# Patient Record
Sex: Male | Born: 1950 | Race: Black or African American | Hispanic: No | Marital: Single | Smoking: Former smoker
Health system: Southern US, Community
[De-identification: ages and names within clinical notes are randomized; demographics above are authoritative.]

---

## 2019-07-26 ENCOUNTER — Observation Stay (HOSPITAL_COMMUNITY): Payer: Medicare Other

## 2019-07-26 ENCOUNTER — Emergency Department (HOSPITAL_COMMUNITY): Payer: Medicare Other

## 2019-07-26 ENCOUNTER — Ambulatory Visit (HOSPITAL_BASED_OUTPATIENT_CLINIC_OR_DEPARTMENT_OTHER): Payer: Medicare Other

## 2019-07-26 ENCOUNTER — Encounter (HOSPITAL_COMMUNITY): Payer: Self-pay | Admitting: Family Medicine

## 2019-07-26 ENCOUNTER — Observation Stay (HOSPITAL_COMMUNITY)
Admission: EM | Admit: 2019-07-26 | Discharge: 2019-07-27 | Disposition: A | Discharge status: Home / Self Care | Payer: Medicare Other | Attending: Internal Medicine | Admitting: Internal Medicine

## 2019-07-26 ENCOUNTER — Other Ambulatory Visit: Payer: Self-pay

## 2019-07-26 DIAGNOSIS — E119 Type 2 diabetes mellitus without complications: Secondary | ICD-10-CM | POA: Insufficient documentation

## 2019-07-26 DIAGNOSIS — Z79899 Other long term (current) drug therapy: Secondary | ICD-10-CM | POA: Diagnosis not present

## 2019-07-26 DIAGNOSIS — F1210 Cannabis abuse, uncomplicated: Secondary | ICD-10-CM | POA: Diagnosis not present

## 2019-07-26 DIAGNOSIS — E785 Hyperlipidemia, unspecified: Secondary | ICD-10-CM | POA: Diagnosis not present

## 2019-07-26 DIAGNOSIS — Z6841 Body Mass Index (BMI) 40.0 and over, adult: Secondary | ICD-10-CM | POA: Diagnosis not present

## 2019-07-26 DIAGNOSIS — I10 Essential (primary) hypertension: Secondary | ICD-10-CM | POA: Diagnosis not present

## 2019-07-26 DIAGNOSIS — I63511 Cerebral infarction due to unspecified occlusion or stenosis of right middle cerebral artery: Secondary | ICD-10-CM | POA: Diagnosis not present

## 2019-07-26 DIAGNOSIS — G8194 Hemiplegia, unspecified affecting left nondominant side: Secondary | ICD-10-CM | POA: Insufficient documentation

## 2019-07-26 DIAGNOSIS — Z20822 Contact with and (suspected) exposure to covid-19: Secondary | ICD-10-CM | POA: Diagnosis not present

## 2019-07-26 DIAGNOSIS — R2689 Other abnormalities of gait and mobility: Secondary | ICD-10-CM | POA: Diagnosis not present

## 2019-07-26 DIAGNOSIS — I2510 Atherosclerotic heart disease of native coronary artery without angina pectoris: Secondary | ICD-10-CM | POA: Diagnosis present

## 2019-07-26 DIAGNOSIS — I639 Cerebral infarction, unspecified: Secondary | ICD-10-CM | POA: Diagnosis present

## 2019-07-26 DIAGNOSIS — Z7982 Long term (current) use of aspirin: Secondary | ICD-10-CM | POA: Insufficient documentation

## 2019-07-26 DIAGNOSIS — Z87891 Personal history of nicotine dependence: Secondary | ICD-10-CM | POA: Insufficient documentation

## 2019-07-26 DIAGNOSIS — E1159 Type 2 diabetes mellitus with other circulatory complications: Secondary | ICD-10-CM

## 2019-07-26 DIAGNOSIS — I340 Nonrheumatic mitral (valve) insufficiency: Secondary | ICD-10-CM

## 2019-07-26 DIAGNOSIS — R531 Weakness: Secondary | ICD-10-CM | POA: Diagnosis present

## 2019-07-26 HISTORY — DX: Atherosclerotic heart disease of native coronary artery without angina pectoris: I25.10

## 2019-07-26 HISTORY — DX: Essential (primary) hypertension: I10

## 2019-07-26 HISTORY — DX: Hyperlipidemia, unspecified: E78.5

## 2019-07-26 LAB — RAPID URINE DRUG SCREEN, HOSP PERFORMED
Amphetamines: NOT DETECTED
Barbiturates: NOT DETECTED
Benzodiazepines: NOT DETECTED
Cocaine: NOT DETECTED
Opiates: NOT DETECTED
Tetrahydrocannabinol: POSITIVE — AB

## 2019-07-26 LAB — I-STAT CHEM 8, ED
BUN: 19 mg/dL (ref 8–23)
Calcium, Ion: 1.17 mmol/L (ref 1.15–1.40)
Chloride: 103 mmol/L (ref 98–111)
Creatinine, Ser: 1.2 mg/dL (ref 0.61–1.24)
Glucose, Bld: 92 mg/dL (ref 70–99)
HCT: 44 % (ref 39.0–52.0)
Hemoglobin: 15 g/dL (ref 13.0–17.0)
Potassium: 4 mmol/L (ref 3.5–5.1)
Sodium: 140 mmol/L (ref 135–145)
TCO2: 29 mmol/L (ref 22–32)

## 2019-07-26 LAB — LIPID PANEL
Cholesterol: 123 mg/dL (ref 0–200)
HDL: 45 mg/dL (ref >40)
LDL Cholesterol: 70 mg/dL (ref 0–99)
Total CHOL/HDL Ratio: 2.7 RATIO
Triglycerides: 39 mg/dL (ref <150)
VLDL: 8 mg/dL (ref 0–40)

## 2019-07-26 LAB — DIFFERENTIAL
Abs Immature Granulocytes: 0.02 10*3/uL (ref 0.00–0.07)
Basophils Absolute: 0.1 10*3/uL (ref 0.0–0.1)
Basophils Relative: 1 %
Eosinophils Absolute: 0.5 10*3/uL (ref 0.0–0.5)
Eosinophils Relative: 5 %
Immature Granulocytes: 0 %
Lymphocytes Relative: 19 %
Lymphs Abs: 1.9 10*3/uL (ref 0.7–4.0)
Monocytes Absolute: 0.6 10*3/uL (ref 0.1–1.0)
Monocytes Relative: 6 %
Neutro Abs: 7.2 10*3/uL (ref 1.7–7.7)
Neutrophils Relative %: 69 %

## 2019-07-26 LAB — CBC
HCT: 42.7 % (ref 39.0–52.0)
Hemoglobin: 13.7 g/dL (ref 13.0–17.0)
MCH: 28.9 pg (ref 26.0–34.0)
MCHC: 32.1 g/dL (ref 30.0–36.0)
MCV: 90.1 fL (ref 80.0–100.0)
Platelets: 288 10*3/uL (ref 150–400)
RBC: 4.74 MIL/uL (ref 4.22–5.81)
RDW: 13.5 % (ref 11.5–15.5)
WBC: 10.2 10*3/uL (ref 4.0–10.5)
nRBC: 0 % (ref 0.0–0.2)

## 2019-07-26 LAB — ECHOCARDIOGRAM COMPLETE
Height: 65 in
Weight: 4352 oz

## 2019-07-26 LAB — COMPREHENSIVE METABOLIC PANEL
ALT: 18 U/L (ref 0–44)
AST: 20 U/L (ref 15–41)
Albumin: 3.3 g/dL — ABNORMAL LOW (ref 3.5–5.0)
Alkaline Phosphatase: 98 U/L (ref 38–126)
Anion gap: 10 (ref 5–15)
BUN: 16 mg/dL (ref 8–23)
CO2: 24 mmol/L (ref 22–32)
Calcium: 9 mg/dL (ref 8.9–10.3)
Chloride: 105 mmol/L (ref 98–111)
Creatinine, Ser: 1.27 mg/dL — ABNORMAL HIGH (ref 0.61–1.24)
GFR calc Af Amer: >60 mL/min (ref >60)
GFR calc non Af Amer: 58 mL/min — ABNORMAL LOW (ref >60)
Glucose, Bld: 98 mg/dL (ref 70–99)
Potassium: 3.9 mmol/L (ref 3.5–5.1)
Sodium: 139 mmol/L (ref 135–145)
Total Bilirubin: 0.6 mg/dL (ref 0.3–1.2)
Total Protein: 6.8 g/dL (ref 6.5–8.1)

## 2019-07-26 LAB — CBG MONITORING, ED: Glucose-Capillary: 97 mg/dL (ref 70–99)

## 2019-07-26 LAB — PROTIME-INR
INR: 1.1 (ref 0.8–1.2)
Prothrombin Time: 14 seconds (ref 11.4–15.2)

## 2019-07-26 LAB — SARS CORONAVIRUS 2 (TAT 6-24 HRS): SARS Coronavirus 2: NEGATIVE

## 2019-07-26 LAB — HEMOGLOBIN A1C
Hgb A1c MFr Bld: 6 % — ABNORMAL HIGH (ref 4.8–5.6)
Mean Plasma Glucose: 125.5 mg/dL

## 2019-07-26 LAB — APTT: aPTT: 29 seconds (ref 24–36)

## 2019-07-26 MED ORDER — ACETAMINOPHEN 325 MG PO TABS: 650.0000 mg | ORAL_TABLET | ORAL | Status: DC | PRN

## 2019-07-26 MED ORDER — SODIUM CHLORIDE 0.9 % IV BOLUS
500.0000 mL | Freq: Once | INTRAVENOUS | Status: AC
  Administered 2019-07-26: 500 mL via INTRAVENOUS

## 2019-07-26 MED ORDER — ACETAMINOPHEN 160 MG/5ML PO SOLN
650.0000 mg | ORAL | Status: DC | PRN
  Administered 2019-07-26: 650 mg
  Filled 2019-07-26: qty 20.3

## 2019-07-26 MED ORDER — SODIUM CHLORIDE 0.9 % IV BOLUS
1000.0000 mL | Freq: Once | INTRAVENOUS | Status: AC
  Administered 2019-07-26: 1000 mL via INTRAVENOUS

## 2019-07-26 MED ORDER — IOHEXOL 350 MG/ML SOLN
50.0000 mL | Freq: Once | INTRAVENOUS | Status: AC | PRN
  Administered 2019-07-26: 50 mL via INTRAVENOUS

## 2019-07-26 MED ORDER — ASPIRIN 325 MG PO TABS
325.0000 mg | ORAL_TABLET | Freq: Every day | ORAL | Status: DC
  Administered 2019-07-26 – 2019-07-27 (×2): 325 mg via ORAL
  Filled 2019-07-26 (×2): qty 1

## 2019-07-26 MED ORDER — ACETAMINOPHEN 650 MG RE SUPP: 650.0000 mg | RECTAL | Status: DC | PRN

## 2019-07-26 MED ORDER — ATORVASTATIN CALCIUM 80 MG PO TABS
80.0000 mg | ORAL_TABLET | Freq: Every day | ORAL | Status: DC
  Administered 2019-07-26 – 2019-07-27 (×2): 80 mg via ORAL
  Filled 2019-07-26 (×2): qty 1

## 2019-07-26 MED ORDER — SODIUM CHLORIDE 0.9% FLUSH
3.0000 mL | Freq: Once | INTRAVENOUS | Status: DC
Start: 2019-07-26 — End: 2019-07-27

## 2019-07-26 MED ORDER — ENOXAPARIN SODIUM 40 MG/0.4ML ~~LOC~~ SOLN
40.0000 mg | Freq: Every day | SUBCUTANEOUS | Status: DC
  Administered 2019-07-26 – 2019-07-27 (×2): 40 mg via SUBCUTANEOUS
  Filled 2019-07-26 (×2): qty 0.4

## 2019-07-26 MED ORDER — ALBUMIN HUMAN 5 % IV SOLN
12.5000 g | Freq: Four times a day (QID) | INTRAVENOUS | Status: AC
  Administered 2019-07-26 – 2019-07-27 (×4): 12.5 g via INTRAVENOUS
  Filled 2019-07-26 (×4): qty 250

## 2019-07-26 MED ORDER — SODIUM CHLORIDE 0.9 % IV SOLN: INTRAVENOUS | Status: DC

## 2019-07-26 MED ORDER — CLOPIDOGREL BISULFATE 75 MG PO TABS
75.0000 mg | ORAL_TABLET | Freq: Every day | ORAL | Status: DC
  Administered 2019-07-26 – 2019-07-27 (×2): 75 mg via ORAL
  Filled 2019-07-26 (×2): qty 1

## 2019-07-26 MED ORDER — ASPIRIN 300 MG RE SUPP: 300.0000 mg | Freq: Every day | RECTAL | Status: DC

## 2019-07-26 MED ORDER — SODIUM CHLORIDE 0.9 % IV SOLN: INTRAVENOUS | Status: AC

## 2019-07-26 MED ORDER — SENNOSIDES-DOCUSATE SODIUM 8.6-50 MG PO TABS: 1.0000 | ORAL_TABLET | Freq: Every evening | ORAL | Status: DC | PRN

## 2019-07-26 MED ORDER — STROKE: EARLY STAGES OF RECOVERY BOOK
Freq: Once | Status: AC
  Filled 2019-07-26: qty 1

## 2019-07-26 NOTE — Consult Note (Signed)
Neurology Consultation  Reason for Consult: Code stroke left-sided weakness Referring Physician: Dr. Betsey Holiday  CC: Left-sided weakness  History is obtained from: EMS, patient  HPI: Philip Gregory is a 69 y.o. male past medical history of anemia, diabetes, hyperlipidemia, hypertension, coronary artery disease, gastroesophageal reflux disease and morbid obesity presented via EMS for evaluation of left-sided weakness of sudden onset. Patient went to bed normal at 8:30 PM on 07/25/2019 and woke up somewhere around 2:30 AM and noted the weakness on the left and called EMS. He initially called EMS for feeling heavy on the left arm but then noticed that he had a leftward gaze and left-sided weakness and brought him in as a LVO positive code stroke. On initial evaluation of the bridge, he was an NIH of 5 with left arm drift hitting the bed, dysarthria and sensory loss on the left.  Taken for emergent head CT which was negative.  CTA head and neck with a left M2 occlusion and CT perfusion-initially not interpretable due to technical difficulty but later on after a few minutes reconfigured by the radiologist with a 12 cc core and 24 cc penumbra in the distal right M2/M3 region. His symptoms started to improve while in the CT and his left upper extremity drift nearly resolved although he remains weak with a 4/5 strength in the left upper extremity.  He still has sensory loss and mild dysarthria.  LKW: 8:30 PM on 07/25/2019 tpa given?: no, outside the window Endovascular candidate: Low stroke scale of 5 on arrival and then 3 on repeat testing-hence not a candidate Premorbid modified Rankin scale (mRS): Uses a cane to walk at baseline - 3  ROS: ROS performed and negative except as noted in HPI.   Past medical history Diabetes Coronary artery disease Anemia Hyperlipidemia Hypertension Reflux   No family history of stroke  Social History:   has no history on file for tobacco, alcohol, and drug. Former  smoker quit 1995.  No alcohol or drug use   Medications From care everywhere Current Outpatient Medications:  . aspirin 81 MG EC tablet *ANTIPLATELET*, Take 1 tablet by mouth once daily, Disp: 90 tablet, Rfl: 3 . blood pressure monitor Kit, by Not Applicable route., Disp: , Rfl:  . blood sugar diagnostic Strp, 1 each., Disp: , Rfl:  . lisinopriL-hydrochlorothiazide (PRINZIDE,ZESTORETIC) 20-12.5 mg per tablet, Take one tablet daily, Disp: 90 tablet, Rfl: 3 . meloxicam (MOBIC) 7.5 MG tablet, Take 1 tablet (7.5 mg total) by mouth daily., Disp: 30 tablet, Rfl: 2 . metoPROLOL succinate (TOPROL-XL) 50 MG 24 hr tablet, TAKE 1 TABLET BY MOUTH ONCE DAILY, Disp: 90 tablet, Rfl: 3 . omeprazole (PRILOSEC) 20 MG capsule, Take 1 capsule (20 mg total) by mouth 2 times daily before meals. Take 30 mins prior to breakfast and dinner, Disp: 180 capsule, Rfl: 3 . rosuvastatin (CRESTOR) 20 MG tablet, Take 1 tablet (20 mg total) by mouth daily., Disp: 90 tablet, Rfl: 3 . furosemide (LASIX) 20 MG tablet, Take 1 tablet (20 mg total) by mouth daily for 10 days., Disp: 10 tablet, Rfl: 0 . hydroxyzine HCL (ATARAX) 25 MG tablet, TAKE 1 TABLET BY MOUTH THREE TIMES DAILY AS NEEDED, Disp: 180 tablet, Rfl: 3 . lancing device with lancets Kit, 1 each by Not Applicable route., Disp: , Rfl:   Exam: Current vital signs: BP 110/69 (BP Location: Right Arm)   Pulse 88   Temp 98.5 F (36.9 C) (Oral)   Resp 17   Ht '5\' 5"'  (1.651 m)  Wt 123.4 kg   SpO2 99%   BMI 45.26 kg/m  Vital signs in last 24 hours: Temp:  [98.5 F (36.9 C)] 98.5 F (36.9 C) (03/11 0405) Pulse Rate:  [88] 88 (03/11 0405) Resp:  [17] 17 (03/11 0405) BP: (110)/(69) 110/69 (03/11 0405) SpO2:  [99 %] 99 % (03/11 0405) Weight:  [123.4 kg] 123.4 kg (03/11 0406) General: Awake alert in no distress HEENT: Normocephalic atraumatic CVs: Regular rate rhythm Abdomen nondistended nontender Extremities warm well perfused with intact pulses Neurological  exam Awake alert oriented x3 Speech is mildly dysarthric No evidence of aphasia Cranial nerves: Pupil on the right is round and reactive, 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 Motor exam: Initial exam left arm barely antigravity and touches the bed in less than 10 seconds.  Left leg able to sustain antigravity but weaker than the right but does not touch the bed in 5 seconds.  Right upper and lower extremity full strength.  On repeat exam, left arm was antigravity with very subtle drift. Sensory exam: Remained nonsensate to light touch on the left throughout.  Extinguishes left on double sinus tach stimulation Coordination: No evidence of dysmetria on the right NIH stroke scale-5 initially, repeat exam NIH 3.  Labs I have reviewed labs in epic and the results pertinent to this consultation are: CBC    Component Value Date/Time   WBC 10.2 07/26/2019 0318   RBC 4.74 07/26/2019 0318   HGB 15.0 07/26/2019 0328   HCT 44.0 07/26/2019 0328   PLT 288 07/26/2019 0318   MCV 90.1 07/26/2019 0318   MCH 28.9 07/26/2019 0318   MCHC 32.1 07/26/2019 0318   RDW 13.5 07/26/2019 0318   LYMPHSABS 1.9 07/26/2019 0318   MONOABS 0.6 07/26/2019 0318   EOSABS 0.5 07/26/2019 0318   BASOSABS 0.1 07/26/2019 0318   CMP     Component Value Date/Time   NA 140 07/26/2019 0328   K 4.0 07/26/2019 0328   CL 103 07/26/2019 0328   CO2 24 07/26/2019 0318   GLUCOSE 92 07/26/2019 0328   BUN 19 07/26/2019 0328   CREATININE 1.20 07/26/2019 0328   CALCIUM 9.0 07/26/2019 0318   PROT 6.8 07/26/2019 0318   ALBUMIN 3.3 (L) 07/26/2019 0318   AST 20 07/26/2019 0318   ALT 18 07/26/2019 0318   ALKPHOS 98 07/26/2019 0318   BILITOT 0.6 07/26/2019 0318   GFRNONAA 58 (L) 07/26/2019  0318   GFRAA >60 07/26/2019 0318   Imaging I have reviewed the images obtained: CT-scan of the brain-no acute changes CTA head and neck with right M2 occlusion.  CT perfusion initial images on the PACS system are marred by some technical artifact.  The radiologist was able to reprocess them and the core is 12 cc and penumbra of 22 cc distal M2/M3 territory on the right.  Images were later available in PACS as well.  Assessment:  69 year old past history of anemia diabetes hypertension hyperlipidemia coronary artery disease former smoker, morbid obesity presenting for left-sided weakness last known normal 8:30 PM prior to going to bed on 07/25/2019 and presenting outside the window for IV TPA. Vascular imaging suggestive of a right M2 occlusion with 12 cc of core and 22 cc of penumbra in the territory. With his NIH stroke scale being  5 at presentation and then lowered to 3 with ongoing improvement, not a candidate for endovascular thrombectomy at this time. Should the symptoms worsen, he can be considered for for a repeat perfusion imaging and intervention.  For now he needs to be admitted for stroke risk factor work-up  Impression: Right MCA stroke-etiology under investigation  Recommendations: -Admit to hospitalist to the stepdown unit -Maintain pressures above 707 systolic and do not treat unless systolic above 867. -Telemetry -Frequent neurochecks-will remain in the window for intervention till 8:30 PM on 07/26/2019 showed his NIH stroke scale worsen-please page neurology stat with any neurological status change. -MRI brain without contrast -2D echo -A1c -Lipid panel -PT OT speech therapy -He is on baby aspirin at home.  Change to aspirin 325 and Plavix 75. -High intensity statin D/W Dr. Betsey Holiday in the ER Stroke neurology will follow with you -- Amie Portland, MD [facility] Pager: [phone] If 7pm to 7am, please call on call as listed on AMION.

## 2019-07-26 NOTE — ED Notes (Signed)
Lunch Tray Ordered @ 1041. 

## 2019-07-26 NOTE — ED Notes (Signed)
Echo in Progress

## 2019-07-26 NOTE — ED Notes (Signed)
UA sent to lab. No order, sent to lab to hold.

## 2019-07-26 NOTE — Progress Notes (Addendum)
Came to assess patient. NIHSS 6 due to left arm weakness, sensory decreased and dysarthria. Noted a right facial droop. Pt reports having left eye visual decreased since the 1970s. In the right eye, pt does not have any deficits noted. MD Roda Shutters made aware that patient was back to arrival exam. Ordered 500 ml bolus along with 100 ml/hr maintenance. HOB flat. Handoff given to [place], Charity fundraiser. BP order 130-160 mmhg

## 2019-07-26 NOTE — Progress Notes (Signed)
  Echocardiogram 2D Echocardiogram has been performed.  [name] 07/26/2019, 10:58 AM

## 2019-07-26 NOTE — Progress Notes (Signed)
Patient's brother Ferne Reus called wanting to get updates on patient . I informed patient's brother due to HIPPA laws I cannot talk to him since his name is not on the patient's chat. He became upset stating " I am the only brother here so I want updates and place my name I the chat". I came to patient room and asked patient if he knows Ron and he confirmed that he is his brother. Patient requested for Ron's name to be place in the chart as a designated visitor.

## 2019-07-26 NOTE — Progress Notes (Signed)
Patient placed in observation after midnight, please see H&P by Dr. Antionette Char.  Here with strokelike symptoms.  On MRI found to have: Acute infarct right parietal lobe which appears to predominantly involve the sensory cortex with a smaller amount of motor cortex involvement. Work-up in progress including echo, PT/OT, stroke neurology recommendations. Patient passed bedside swallow evaluation so diet will be initiated. Labs: HbgA1c: 6 LDL: 70 Continue tele  Marlin Canary

## 2019-07-26 NOTE — ED Provider Notes (Signed)
[facility] EMERGENCY DEPARTMENT Provider Note   CSN: [id number] Arrival date & time: 07/26/19  0315     History No chief complaint on file.   Philip Gregory is a 69 y.o. male.  Patient presents to the emergency department for evaluation as a code stroke.  Patient went to bed at 8:30 PM in his normal state of health.  He woke up at 2:15 AM to go to the bathroom and had trouble getting out of bed because his left leg was not working.  He then noticed that he had numbness, tingling and weakness of the left upper extremity as well.  He called EMS.  EMS report that initially he had dense weakness of left upper and left lower extremity with right gaze preference.  During transport symptoms have improved, however.        No past medical history on file.  There are no problems to display for this patient.      No family history on file.  Social History   Tobacco Use  . Smoking status: Not on file  Substance Use Topics  . Alcohol use: Not on file  . Drug use: Not on file    Home Medications Prior to Admission medications   Not on File    Allergies    Patient has no allergy information on record.  Review of Systems   Review of Systems  Neurological: Positive for weakness and numbness.  All other systems reviewed and are negative.   Physical Exam Updated Vital Signs There were no vitals taken for this visit.  Physical Exam Vitals and nursing note reviewed.  Constitutional:      General: He is not in acute distress.    Appearance: Normal appearance. He is well-developed.  HENT:     Head: Normocephalic and atraumatic.     Right Ear: Hearing normal.     Left Ear: Hearing normal.     Nose: Nose normal.  Eyes:     General: No visual field deficit.    Conjunctiva/sclera: Conjunctivae normal.     Pupils: Pupils are equal, round, and reactive to light.  Cardiovascular:     Rate and Rhythm: Regular rhythm.     Heart sounds: S1 normal and S2  normal. No murmur. No friction rub. No gallop.   Pulmonary:     Effort: Pulmonary effort is normal. No respiratory distress.     Breath sounds: Normal breath sounds.  Chest:     Chest wall: No tenderness.  Abdominal:     General: Bowel sounds are normal.     Palpations: Abdomen is soft.     Tenderness: There is no abdominal tenderness. There is no guarding or rebound. Negative signs include Murphy's sign and McBurney's sign.     Hernia: No hernia is present.  Musculoskeletal:        General: Normal range of motion.     Cervical back: Normal range of motion and neck supple.  Skin:    General: Skin is warm and dry.     Findings: No rash.  Neurological:     Mental Status: He is alert and oriented to person, place, and time.     GCS: GCS eye subscore is 4. GCS verbal subscore is 5. GCS motor subscore is 6.     Cranial Nerves: Dysarthria and facial asymmetry present.     Sensory: Sensation is intact. No sensory deficit.     Motor: Motor function is intact.  Coordination: Coordination normal.     Comments: L hemiparesis  Psychiatric:        Speech: Speech normal.        Behavior: Behavior normal.        Thought Content: Thought content normal.     ED Results / Procedures / Treatments   Labs (all labs ordered are listed, but only abnormal results are displayed) Labs Reviewed  PROTIME-INR  APTT  CBC  DIFFERENTIAL  COMPREHENSIVE METABOLIC PANEL  CBG MONITORING, ED  I-STAT CHEM 8, ED  CBG MONITORING, ED    EKG None  Radiology CT HEAD CODE STROKE WO CONTRAST  Result Date: 07/26/2019 CLINICAL DATA:  Code stroke. Initial evaluation for acute left-sided weakness, numbness, possible stroke. EXAM: CT HEAD WITHOUT CONTRAST TECHNIQUE: Contiguous axial images were obtained from the base of the skull through the vertex without intravenous contrast. COMPARISON:  None available. FINDINGS: Brain: Cerebral volume within normal limits for age. No acute intracranial hemorrhage. No acute  large vessel territory infarct. No mass lesion, mass effect, or midline shift. No hydrocephalus. No extra-axial fluid collection. Vascular: No hyperdense vessel. Scattered vascular calcifications noted within the carotid siphons. Punctate hyperdensity seen on a single slice at the right sylvian fissure favored to reflect a small focal plaque (series 3, image 20). Skull: Scalp soft tissues within normal limits.  Calvarium intact. Sinuses/Orbits: Globes and orbital soft tissues demonstrate no acute finding. Chronic mucoperiosteal thickening noted throughout the paranasal sinuses. Mastoid air cells are clear. Other: None. ASPECTS [facility] Stroke Program Early CT Score) - Ganglionic level infarction (caudate, lentiform nuclei, internal capsule, insula, M1-M3 cortex): 7 - Supraganglionic infarction (M4-M6 cortex): 3 Total score (0-10 with 10 being normal): 10 IMPRESSION: 1. No acute intracranial infarct or other abnormality. 2. ASPECTS is 10. These results were communicated to Dr. Wilford Corner at 3:36 amon 3/11/2021by text page via the [facility] messaging system. Electronically Signed   By: Rise Mu M.D.   On: 07/26/2019 03:39    Procedures Procedures (including critical care time)  Medications Ordered in ED Medications  sodium chloride flush (NS) 0.9 % injection 3 mL (has no administration in time range)  iohexol (OMNIPAQUE) 350 MG/ML injection 50 mL (50 mLs Intravenous Contrast Given 07/26/19 0352)  iohexol (OMNIPAQUE) 350 MG/ML injection 50 mL (50 mLs Intravenous Contrast Given 07/26/19 0351)    ED Course  I have reviewed the triage vital signs and the nursing notes.  Pertinent labs & imaging results that were available during my care of the patient were reviewed by me and considered in my medical decision making (see chart for details).    MDM Rules/Calculators/A&P                      Patient presents to the emergency department for evaluation as a code stroke.  Patient went to bed at 8:30 PM  and then awakened at 2:15 AM with left hemiparesis.  At arrival to the ER, patient with persistent left upper and left lower extremity weakness and decreased sensation.  EMS that brought the patient to the ER did agree that the symptoms seem to be improving.  They reported essentially flaccid left upper extremity and potentially a gaze preference initially.  Based on last known normal of 8:30 PM, patient was out of the window for TPA on arrival.  Patient received and evaluated in conjunction with Dr. Wilford Corner, neurology.  Neuro imaging did not show any large vessel occlusions that would require IR intervention.  Patient will be  admitted for further stroke work-up.  Final Clinical Impression(s) / ED Diagnoses Final diagnoses:  Cerebrovascular accident (CVA), unspecified mechanism ([place])    Rx / DC Orders ED Discharge Orders    None       Krisandra Bueno, Gwenyth Allegra, MD 07/26/19 [phone]

## 2019-07-26 NOTE — Progress Notes (Signed)
PT Cancellation Note  Patient Details Name: Philip Gregory MRN: [id number] DOB: 04/09/1951   Cancelled Treatment:    Reason Eval/Treat Not Completed: Active bedrest order Pt with bedrest orders. Will follow up as activity orders updated and as schedule allows.   Cindee Salt, DPT  Acute Rehabilitation Services  Pager: [phone] Office: [phone]    [name] 07/26/2019, 3:23 PM

## 2019-07-26 NOTE — H&P (Signed)
History and Physical    Philip Gregory JXB:[id number] DOB: 11/27/1950 DOA: 07/26/2019  PCP: No primary care provider on file.   Patient coming from: Home   Chief Complaint: Left-sided numbness and weakness   HPI: Philip Gregory is a 69 y.o. male with medical history significant for nonobstructive coronary artery disease, hypertension, hyperlipidemia, diet-controlled diabetes mellitus, who presented to the emergency department with acute onset of left-sided numbness and weakness.  Patient reports that he was in his usual state when he went to bed last night at approximately 8:30 PM but woke this morning at around 2 AM to go to the bathroom but was unable to get out of bed due to numbness and weakness involving the left arm and leg.  He also reports numbness in the left face.  He denies any recent chest pain or palpitations, denies headache, fall, trauma, and has not had any fevers or chills.  He has never had similar symptoms  ED Course: Upon arrival to the ED, patient is found to be afebrile, saturating well on room air, and with normal heart rate and blood pressure.  EKG features sinus rhythm with first-degree AV nodal block, IVCD, LVH, and nonspecific ST and ST abnormality.  Noncontrast head CT was negative for acute intracranial bleed.  Chemistry panel notable for creatinine 1.27, similar to prior.  CBC was unremarkable.  CTA head and neck with acute occlusion of distal right M3 branch, and perfusion study notable for 12 cc acute cord infarct involving the posterior right frontal region with 22 since ischemic penumbra.  Neurology evaluated the patient upon his arrival to the ED and hospitalist were consulted for admission.  Review of Systems:  All other systems reviewed and apart from HPI, are negative.  Past Medical History:  Diagnosis Date  . CAD (coronary artery disease) 07/26/2019  . Essential hypertension 07/26/2019  . Hyperlipidemia 07/26/2019    History reviewed. No pertinent surgical  history.   reports that he quit smoking about 26 years ago. He has never used smokeless tobacco. He reports current alcohol use of about 2.0 standard drinks of alcohol per week. No history on file for drug.  No Known Allergies  Family History  Problem Relation Age of Onset  . Hypertension Mother   . Diabetes Mother   . Cancer Father   . Hypertension Sister      Prior to Admission medications   Not on File    Physical Exam: Vitals:   07/26/19 0401 07/26/19 0405 07/26/19 0406 07/26/19 0430  BP: 110/69 110/69  128/63  Pulse: 88 88  90  Resp: 17 17  18   Temp:  98.5 F (36.9 C)    TempSrc:  Oral    SpO2: 100% 99%  99%  Weight:   123.4 kg   Height:   5\' 5"  (1.651 m)     Constitutional: NAD, calm  Eyes: PERTLA, lids and conjunctivae normal ENMT: Mucous membranes are moist. Posterior pharynx clear of any exudate or lesions.   Neck: normal, supple, no masses, no thyromegaly Respiratory: clear to auscultation bilaterally, no wheezing, no crackles. No accessory muscle use.  Cardiovascular: S1 & S2 heard, regular rate and rhythm. No extremity edema.   Abdomen: No distension, no tenderness, soft. Bowel sounds active.  Musculoskeletal: no clubbing / cyanosis. No joint deformity upper and lower extremities.   Skin: no significant rashes, lesions, ulcers. Warm, dry, well-perfused. Neurologic: Dysconjugate gaze, PERRL, EOMI, left facial numbness and weakness, mild dysarthria, no aphasia. Sensation to light touch diminished  throughout left face, left arm, and left leg. Strength 5/5 throughout RUE and RLE, 3/5 throughout LUE and LLE.  Psychiatric: Alert and oriented x 3. Very pleasant and cooperative.    Labs and Imaging on Admission: I have personally reviewed following labs and imaging studies  CBC: Recent Labs  Lab 07/26/19 0318 07/26/19 0328  WBC 10.2  --   NEUTROABS 7.2  --   HGB 13.7 15.0  HCT 42.7 44.0  MCV 90.1  --   PLT 288  --    Basic Metabolic Panel: Recent Labs    Lab 07/26/19 0318 07/26/19 0328  NA 139 140  K 3.9 4.0  CL 105 103  CO2 24  --   GLUCOSE 98 92  BUN 16 19  CREATININE 1.27* 1.20  CALCIUM 9.0  --    GFR: Estimated Creatinine Clearance: 73.9 mL/min (by C-G formula based on SCr of 1.2 mg/dL). Liver Function Tests: Recent Labs  Lab 07/26/19 0318  AST 20  ALT 18  ALKPHOS 98  BILITOT 0.6  PROT 6.8  ALBUMIN 3.3*   No results for input(s): LIPASE, AMYLASE in the last 168 hours. No results for input(s): AMMONIA in the last 168 hours. Coagulation Profile: Recent Labs  Lab 07/26/19 0318  INR 1.1   Cardiac Enzymes: No results for input(s): CKTOTAL, CKMB, CKMBINDEX, TROPONINI in the last 168 hours. BNP (last 3 results) No results for input(s): PROBNP in the last 8760 hours. HbA1C: No results for input(s): HGBA1C in the last 72 hours. CBG: Recent Labs  Lab 07/26/19 0318  GLUCAP 97   Lipid Profile: No results for input(s): CHOL, HDL, LDLCALC, TRIG, CHOLHDL, LDLDIRECT in the last 72 hours. Thyroid Function Tests: No results for input(s): TSH, T4TOTAL, FREET4, T3FREE, THYROIDAB in the last 72 hours. Anemia Panel: No results for input(s): VITAMINB12, FOLATE, FERRITIN, TIBC, IRON, RETICCTPCT in the last 72 hours. Urine analysis: No results found for: COLORURINE, APPEARANCEUR, LABSPEC, PHURINE, GLUCOSEU, HGBUR, BILIRUBINUR, KETONESUR, PROTEINUR, UROBILINOGEN, NITRITE, LEUKOCYTESUR Sepsis Labs: @LABRCNTIP (procalcitonin:4,lacticidven:4) )No results found for this or any previous visit (from the past 240 hour(s)).   Radiological Exams on Admission: CT HEAD CODE STROKE WO CONTRAST  Result Date: 07/26/2019 CLINICAL DATA:  Code stroke. Initial evaluation for acute left-sided weakness, numbness, possible stroke. EXAM: CT HEAD WITHOUT CONTRAST TECHNIQUE: Contiguous axial images were obtained from the base of the skull through the vertex without intravenous contrast. COMPARISON:  None available. FINDINGS: Brain: Cerebral volume  within normal limits for age. No acute intracranial hemorrhage. No acute large vessel territory infarct. No mass lesion, mass effect, or midline shift. No hydrocephalus. No extra-axial fluid collection. Vascular: No hyperdense vessel. Scattered vascular calcifications noted within the carotid siphons. Punctate hyperdensity seen on a single slice at the right sylvian fissure favored to reflect a small focal plaque (series 3, image 20). Skull: Scalp soft tissues within normal limits.  Calvarium intact. Sinuses/Orbits: Globes and orbital soft tissues demonstrate no acute finding. Chronic mucoperiosteal thickening noted throughout the paranasal sinuses. Mastoid air cells are clear. Other: None. ASPECTS [facility] Stroke Program Early CT Score) - Ganglionic level infarction (caudate, lentiform nuclei, internal capsule, insula, M1-M3 cortex): 7 - Supraganglionic infarction (M4-M6 cortex): 3 Total score (0-10 with 10 being normal): 10 IMPRESSION: 1. No acute intracranial infarct or other abnormality. 2. ASPECTS is 10. These results were communicated to Dr. Rory Percy at 3:36 amon 3/11/2021by text page via the [facility] messaging system. Electronically Signed   By: Jeannine Boga M.D.   On: 07/26/2019 03:39  EKG: Independently reviewed. Sinus rhythm, 1st degree AV block, RBBB, LVH, repolarization abnormality.   Assessment/Plan   1. Acute ischemic right MCA stroke  - Presents with left-sided weakness, noted upon waking at 02:00 after going to bed at 20:30 the night prior  - Neurology saw him on arrival as code stroke  - CT head with no acute findings, CTA head & neck with right M2 occlusion, perfusion images with 12 cc core and 22 cc penumbra in distal right M2/M3 territory  - Continue cardiac monitoring, frequent neuro checks, remains in window for intervention until 20:30 on 3/11, page neuro for worsening status - Check MRI brain, echocardiogram, fasting lipids, A1c  - Consult PT, OT, and SLP  - Start ASA 325,  Plavix 75, and high-intensity statin    2. CAD - No anginal complaints  - Continue antiplatelets, statin    3. Hypertension  - Allow for SBP up to 220 in acute-phase ischemic CVA     DVT prophylaxis: Lovenox  Code Status: Full  Family Communication: Discussed with patient  Disposition Plan: Likely back home pending therapy assessments  Consults called: Neurology  Admission status: Observation     Briscoe Deutscher, MD [facility] Pager: See [email or web address]  If 7AM-7PM, please contact the daytime attending [email or web address]  07/26/2019, 4:34 AM

## 2019-07-26 NOTE — Progress Notes (Addendum)
STROKE TEAM PROGRESS NOTE   INTERVAL HISTORY His RN is at the bedside.  Patient was sitting in bed for breakfast.  However, was told that patient NIH score this morning was 6, slightly worse than yesterday.  He has BP around 110s, and RN told me that his blood pressure early this morning was in 90s.  Please his head of bed flat, NS 500 bolus and continue IV fluid with albumin.  Vitals:   07/26/19 1400 07/26/19 1415 07/26/19 1430 07/26/19 1445  BP: 124/75 114/68 118/64 116/81  Pulse: 89 89 90 88  Resp: 20 (!) 23 (!) 25 13  Temp:      TempSrc:      SpO2: 97% 100% 100% 98%  Weight:      Height:        CBC:  Recent Labs  Lab 07/26/19 0318 07/26/19 0328  WBC 10.2  --   NEUTROABS 7.2  --   HGB 13.7 15.0  HCT 42.7 44.0  MCV 90.1  --   PLT 288  --     Basic Metabolic Panel:  Recent Labs  Lab 07/26/19 0318 07/26/19 0328  NA 139 140  K 3.9 4.0  CL 105 103  CO2 24  --   GLUCOSE 98 92  BUN 16 19  CREATININE 1.27* 1.20  CALCIUM 9.0  --    Lipid Panel:     Component Value Date/Time   CHOL 123 07/26/2019 0727   TRIG 39 07/26/2019 0727   HDL 45 07/26/2019 0727   CHOLHDL 2.7 07/26/2019 0727   VLDL 8 07/26/2019 0727   LDLCALC 70 07/26/2019 0727   HgbA1c:  Lab Results  Component Value Date   HGBA1C 6.0 (H) 07/26/2019   Urine Drug Screen:     Component Value Date/Time   LABOPIA NONE DETECTED 07/26/2019 0846   COCAINSCRNUR NONE DETECTED 07/26/2019 0846   LABBENZ NONE DETECTED 07/26/2019 0846   AMPHETMU NONE DETECTED 07/26/2019 0846   THCU POSITIVE (A) 07/26/2019 0846   LABBARB NONE DETECTED 07/26/2019 0846    Alcohol Level No results found for: ETH  IMAGING past 24 hours CT Code Stroke CTA Head W/WO contrast  Result Date: 07/26/2019 CLINICAL DATA:  Initial evaluation for acute left-sided weakness. EXAM: CT ANGIOGRAPHY HEAD AND NECK CT PERFUSION BRAIN TECHNIQUE: Multidetector CT imaging of the head and neck was performed using the standard protocol during bolus  administration of intravenous contrast. Multiplanar CT image reconstructions and MIPs were obtained to evaluate the vascular anatomy. Carotid stenosis measurements (when applicable) are obtained utilizing NASCET criteria, using the distal internal carotid diameter as the denominator. Multiphase CT imaging of the brain was performed following IV bolus contrast injection. Subsequent parametric perfusion maps were calculated using RAPID software. CONTRAST:  50mL OMNIPAQUE IOHEXOL 350 MG/ML SOLN, 50mL OMNIPAQUE IOHEXOL 350 MG/ML SOLN COMPARISON:  Prior head CT from earlier the same day. FINDINGS: CTA NECK FINDINGS Aortic arch: Visualized aortic arch normal in caliber with normal 3 vessel morphology. Moderate atherosclerotic change about the arch and origin of the great vessels without hemodynamically significant stenosis. Visualized subclavian arteries patent without stenosis. Right carotid system: Right common carotid artery patent from its origin to the bifurcation without stenosis. Mild scattered mixed plaque about the right bifurcation/proximal right ICA without significant stenosis. Right ICA patent distally to the skull base without stenosis, dissection or occlusion. Left carotid system: Left common carotid artery patent from its origin to the bifurcation without significant stenosis. Mixed plaque about the left bifurcation/proximal left ICA without  hemodynamically significant stenosis. Left ICA patent distally to the skull base without stenosis, dissection or occlusion. Vertebral arteries: Both vertebral arteries arise from the subclavian arteries. Focal plaque at the origin of the right vertebral artery with severe at least 80% ostial stenosis (series 8, image 276). Right vertebral artery otherwise widely patent within the neck. 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 Scant distal reconstitution at the left V3 segment, with  attenuated flow as it courses into the cranial vault. 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 Skeleton: No acute osseous abnormality. No worrisome osseous lesions. Moderate to advanced multilevel cervical spondylosis, most notable at C5-6 and C6-7. Chronic grade 1 facet mediated anterolisthesis of C4 on C5. Other neck: No other acute soft tissue abnormality within the neck. No appreciable mass lesion or adenopathy. Upper chest: Mild ground-glass opacity with diffuse interlobular septal thickening seen within the visualized lungs, suggesting mild pulmonary interstitial edema. Visualized upper chest demonstrates no other acute finding. Review of the MIP images confirms the above findings CTA HEAD FINDINGS Anterior circulation: Petrous segments widely patent. Mild scattered atheromatous plaque within the cavernous/supraclinoid ICAs without significant stenosis. ICA termini well perfused. A1 segments patent bilaterally. Normal anterior communicating artery. Anterior cerebral arteries widely patent to their distal aspects. No M1 stenosis or occlusion. Normal MCA bifurcations. On the right, there is abrupt occlusion of the distal left M3 branch (series 8, image 78). Right MCA branches otherwise perfused. No large vessel occlusion seen on the left. Posterior circulation: Dominant right vertebral artery patent to the vertebrobasilar junction without stenosis. Hypoplastic left vertebral artery attenuated but patent as it courses into the cranial vault, and remains patent to the vertebrobasilar junction. Posterior inferior cerebral arteries patent bilaterally. Basilar diminutive but patent to its distal aspect without stenosis. Superior cerebral arteries patent bilaterally. Fetal type origin of both PCAs supplied via a robust posterior communicating arteries. Both PCAs well perfused to their distal aspects without stenosis. Venous sinuses: Patent allowing for timing the contrast bolus. Anatomic variants: Fetal type  origin of the PCAs with diminutive vertebrobasilar system. Review of the MIP images confirms the above findings CT Brain Perfusion Findings: ASPECTS: 10 CBF (<30%) Volume: 64mL Perfusion (Tmax>6.0s) volume: 71mL Mismatch Volume: 31mL Infarction Location:Acute core infarct seen involving the posterior right MCA distribution, posterior right region. Finding consistent with the right M3 occlusion. 10 cc surrounding ischemic penumbra. IMPRESSION: CTA HEAD AND NECK IMPRESSION: 1. Positive CTA with acute occlusion of a distal right M3 branch. 2. 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 3. Mild-to-moderate atherosclerotic change elsewhere about the major arterial vasculature of the head and neck. No other hemodynamically significant or correctable stenosis. 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 22 cc ischemic penumbra, with mismatch volume of 10 cc. Critical Value/emergent results were called by telephone at the time of interpretation on 07/26/2019 at 3:50 am to provider [facility] , who verbally acknowledged these results. Electronically Signed   By: Rise Mu M.D.   On: 07/26/2019 04:42   CT Code Stroke CTA Neck W/WO contrast  Result Date: 07/26/2019 CLINICAL DATA:  Initial evaluation for acute left-sided weakness. EXAM: CT ANGIOGRAPHY HEAD AND NECK CT PERFUSION BRAIN TECHNIQUE: Multidetector CT imaging of the head and neck was performed  using the standard protocol during bolus administration of intravenous contrast. Multiplanar CT image reconstructions and MIPs were obtained to evaluate the vascular anatomy. Carotid stenosis measurements (when applicable) are obtained utilizing NASCET criteria, using the distal internal carotid diameter as the  denominator. Multiphase CT imaging of the brain was performed following IV bolus contrast injection. Subsequent parametric perfusion maps were calculated using RAPID software. CONTRAST:  50mL OMNIPAQUE IOHEXOL 350 MG/ML SOLN, 50mL OMNIPAQUE IOHEXOL 350 MG/ML SOLN COMPARISON:  Prior head CT from earlier the same day. FINDINGS: CTA NECK FINDINGS Aortic arch: Visualized aortic arch normal in caliber with normal 3 vessel morphology. Moderate atherosclerotic change about the arch and origin of the great vessels without hemodynamically significant stenosis. Visualized subclavian arteries patent without stenosis. Right carotid system: Right common carotid artery patent from its origin to the bifurcation without stenosis. Mild scattered mixed plaque about the right bifurcation/proximal right ICA without significant stenosis. Right ICA patent distally to the skull base without stenosis, dissection or occlusion. Left carotid system: Left common carotid artery patent from its origin to the bifurcation without significant stenosis. Mixed plaque about the left bifurcation/proximal left ICA without hemodynamically significant stenosis. Left ICA patent distally to the skull base without stenosis, dissection or occlusion. Vertebral arteries: Both vertebral arteries arise from the subclavian arteries. Focal plaque at the origin of the right vertebral artery with severe at least 80% ostial stenosis (series 8, image 276). Right vertebral artery otherwise widely patent within the neck. 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 Scant distal reconstitution at the left V3 segment, with attenuated flow as it courses into the cranial vault. 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 Skeleton: No acute osseous abnormality. No worrisome osseous lesions. Moderate to advanced multilevel cervical spondylosis, most notable at C5-6 and C6-7. Chronic grade 1  facet mediated anterolisthesis of C4 on C5. Other neck: No other acute soft tissue abnormality within the neck. No appreciable mass lesion or adenopathy. Upper chest: Mild ground-glass opacity with diffuse interlobular septal thickening seen within the visualized lungs, suggesting mild pulmonary interstitial edema. Visualized upper chest demonstrates no other acute finding. Review of the MIP images confirms the above findings CTA HEAD FINDINGS Anterior circulation: Petrous segments widely patent. Mild scattered atheromatous plaque within the cavernous/supraclinoid ICAs without significant stenosis. ICA termini well perfused. A1 segments patent bilaterally. Normal anterior communicating artery. Anterior cerebral arteries widely patent to their distal aspects. No M1 stenosis or occlusion. Normal MCA bifurcations. On the right, there is abrupt occlusion of the distal left M3 branch (series 8, image 78). Right MCA branches otherwise perfused. No large vessel occlusion seen on the left. Posterior circulation: Dominant right vertebral artery patent to the vertebrobasilar junction without stenosis. Hypoplastic left vertebral artery attenuated but patent as it courses into the cranial vault, and remains patent to the vertebrobasilar junction. Posterior inferior cerebral arteries patent bilaterally. Basilar diminutive but patent to its distal aspect without stenosis. Superior cerebral arteries patent bilaterally. Fetal type origin of both PCAs supplied via a robust posterior communicating arteries. Both PCAs well perfused to their distal aspects without stenosis. Venous sinuses: Patent allowing for timing the contrast bolus. Anatomic variants: Fetal type origin of the PCAs with diminutive vertebrobasilar system. Review of the MIP images confirms the above findings CT Brain Perfusion Findings: ASPECTS: 10 CBF (<30%) Volume: 12mL Perfusion (Tmax>6.0s) volume: 22mL Mismatch Volume: 10mL Infarction Location:Acute core infarct  seen involving the posterior right MCA distribution, posterior right region.  Finding consistent with the right M3 occlusion. 10 cc surrounding ischemic penumbra. IMPRESSION: CTA HEAD AND NECK IMPRESSION: 1. Positive CTA with acute occlusion of a distal right M3 branch. 2. 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 3. Mild-to-moderate atherosclerotic change elsewhere about the major arterial vasculature of the head and neck. No other hemodynamically significant or correctable stenosis. 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 22 cc ischemic penumbra, with mismatch volume of 10 cc. Critical Value/emergent results were called by telephone at the time of interpretation on 07/26/2019 at 3:50 am [facility] , who verbally acknowledged these results. Electronically Signed   By: Rise MuBenjamin  McClintock M.D.   On: 07/26/2019 04:42   MR BRAIN WO CONTRAST  Result Date: 07/26/2019 CLINICAL DATA:  Stroke follow-up. EXAM: MRI HEAD WITHOUT CONTRAST TECHNIQUE: Multiplanar, multiecho pulse sequences of the brain and surrounding structures were obtained without intravenous contrast. COMPARISON:  CTA head and CT perfusion earlier today FINDINGS: Brain: Acute infarct right parietal lobe. This appears to involve primarily the sensory cortex but also some of the motor cortex. No associated hemorrhage Ventricle size normal. Negative for mass lesion. No significant chronic ischemia. Vascular: Normal arterial flow voids. Skull and upper cervical spine: Negative Sinuses/Orbits: Mild mucosal edema paranasal sinuses. Disconjugate gaze. No orbital mass. Other: None IMPRESSION: Acute infarct right parietal lobe which appears to predominantly involve the sensory cortex with a  smaller amount of motor cortex involvement. No associated hemorrhage. No chronic ischemia. Electronically Signed   By: Marlan Palauharles  Clark M.D.   On: 07/26/2019 10:01   CT Code Stroke Cerebral Perfusion with contrast  Result Date: 07/26/2019 CLINICAL DATA:  Initial evaluation for acute left-sided weakness. EXAM: CT ANGIOGRAPHY HEAD AND NECK CT PERFUSION BRAIN TECHNIQUE: Multidetector CT imaging of the head and neck was performed using the standard protocol during bolus administration of intravenous contrast. Multiplanar CT image reconstructions and MIPs were obtained to evaluate the vascular anatomy. Carotid stenosis measurements (when applicable) are obtained utilizing NASCET criteria, using the distal internal carotid diameter as the denominator. Multiphase CT imaging of the brain was performed following IV bolus contrast injection. Subsequent parametric perfusion maps were calculated using RAPID software. CONTRAST:  50mL OMNIPAQUE IOHEXOL 350 MG/ML SOLN, 50mL OMNIPAQUE IOHEXOL 350 MG/ML SOLN COMPARISON:  Prior head CT from earlier the same day. FINDINGS: CTA NECK FINDINGS Aortic arch: Visualized aortic arch normal in caliber with normal 3 vessel morphology. Moderate atherosclerotic change about the arch and origin of the great vessels without hemodynamically significant stenosis. Visualized subclavian arteries patent without stenosis. Right carotid system: Right common carotid artery patent from its origin to the bifurcation without stenosis. Mild scattered mixed plaque about the right bifurcation/proximal right ICA without significant stenosis. Right ICA patent distally to the skull base without stenosis, dissection or occlusion. Left carotid system: Left common carotid artery patent from its origin to the bifurcation without significant stenosis. Mixed plaque about the left bifurcation/proximal left ICA without hemodynamically significant stenosis. Left ICA patent distally to the skull base without stenosis,  dissection or occlusion. Vertebral arteries: Both vertebral arteries arise from the subclavian arteries. Focal plaque at the origin of the right vertebral artery with severe at least 80% ostial stenosis (series 8, image 276). Right vertebral artery otherwise widely patent within the neck. Left vertebral artery occluded  at its origin from the left subclavian artery, and remains largely occluded within the neck. Scant distal reconstitution at the left V3 segment, with attenuated flow as it courses into the cranial vault. 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 Skeleton: No acute osseous abnormality. No worrisome osseous lesions. Moderate to advanced multilevel cervical spondylosis, most notable at C5-6 and C6-7. Chronic grade 1 facet mediated anterolisthesis of C4 on C5. Other neck: No other acute soft tissue abnormality within the neck. No appreciable mass lesion or adenopathy. Upper chest: Mild ground-glass opacity with diffuse interlobular septal thickening seen within the visualized lungs, suggesting mild pulmonary interstitial edema. Visualized upper chest demonstrates no other acute finding. Review of the MIP images confirms the above findings CTA HEAD FINDINGS Anterior circulation: Petrous segments widely patent. Mild scattered atheromatous plaque within the cavernous/supraclinoid ICAs without significant stenosis. ICA termini well perfused. A1 segments patent bilaterally. Normal anterior communicating artery. Anterior cerebral arteries widely patent to their distal aspects. No M1 stenosis or occlusion. Normal MCA bifurcations. On the right, there is abrupt occlusion of the distal left M3 branch (series 8, image 78). Right MCA branches otherwise perfused. No large vessel occlusion seen on the left. Posterior circulation: Dominant right vertebral artery patent to the vertebrobasilar junction without stenosis. Hypoplastic left vertebral artery attenuated but patent as it courses into the cranial vault,  and remains patent to the vertebrobasilar junction. Posterior inferior cerebral arteries patent bilaterally. Basilar diminutive but patent to its distal aspect without stenosis. Superior cerebral arteries patent bilaterally. Fetal type origin of both PCAs supplied via a robust posterior communicating arteries. Both PCAs well perfused to their distal aspects without stenosis. Venous sinuses: Patent allowing for timing the contrast bolus. Anatomic variants: Fetal type origin of the PCAs with diminutive vertebrobasilar system. Review of the MIP images confirms the above findings CT Brain Perfusion Findings: ASPECTS: 10 CBF (<30%) Volume: 12mL Perfusion (Tmax>6.0s) volume: 22mL Mismatch Volume: 10mL Infarction Location:Acute core infarct seen involving the posterior right MCA distribution, posterior right region. Finding consistent with the right M3 occlusion. 10 cc surrounding ischemic penumbra. IMPRESSION: CTA HEAD AND NECK IMPRESSION: 1. Positive CTA with acute occlusion of a distal right M3 branch. 2. 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 3. Mild-to-moderate atherosclerotic change elsewhere about the major arterial vasculature of the head and neck. No other hemodynamically significant or correctable stenosis. 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 22 cc ischemic penumbra, with mismatch volume of 10 cc. Critical Value/emergent results were called by telephone at the time of interpretation on 07/26/2019 at 3:50 am to provider [facility] , who verbally acknowledged these results. Electronically Signed   By: Rise Mu M.D.   On: 07/26/2019 04:42   ECHOCARDIOGRAM COMPLETE  Result Date: 07/26/2019    ECHOCARDIOGRAM REPORT   Patient Name:   Philip Gregory Date of Exam: 07/26/2019 Medical Rec #:  [id number]   Height:       65.0 in Accession #:    [id number]  Weight:       272.0 lb Date of Birth:  [birth year]  BSA:          2.254 m Patient Age:    68 years    BP:           115/73 mmHg Patient Gender: M  HR:           91 bpm. Exam Location:  Inpatient Procedure: 2D Echo, Cardiac Doppler and Color Doppler Indications:    Stroke 434.91/I163.9  History:        Patient has no prior history of Echocardiogram examinations.                 CAD; Risk Factors:Hypertension and Dyslipidemia.  Sonographer:    Ross Ludwig RDCS (AE) Referring Phys: [id number] TIMOTHY S OPYD  Sonographer Comments: Patient is morbidly obese. IMPRESSIONS  1. Left ventricular ejection fraction, by estimation, is 50 to 55%. The left ventricle has low normal function. The left ventricle has no regional wall motion abnormalities. There is severe concentric left ventricular hypertrophy. Left ventricular diastolic parameters are consistent with Grade II diastolic dysfunction (pseudonormalization).  2. Right ventricular systolic function is normal. The right ventricular size is normal. There is normal pulmonary artery systolic pressure.  3. Right atrial size was moderately dilated.  4. The mitral valve is normal in structure. Mild mitral valve regurgitation. No evidence of mitral stenosis.  5. The aortic valve is tricuspid. Aortic valve regurgitation is not visualized. No aortic stenosis is present.  6. The inferior vena cava is normal in size with greater than 50% respiratory variability, suggesting right atrial pressure of 3 mmHg. FINDINGS  Left Ventricle: Left ventricular ejection fraction, by estimation, is 50 to 55%. The left ventricle has low normal function. The left ventricle has no regional wall motion abnormalities. The left ventricular internal cavity size was normal in size. There is severe concentric left ventricular hypertrophy. Left ventricular diastolic parameters are consistent with  Grade II diastolic dysfunction (pseudonormalization). Right Ventricle: The right ventricular size is normal. No increase in right ventricular wall thickness. Right ventricular systolic function is normal. There is normal pulmonary artery systolic pressure. The tricuspid regurgitant velocity is 2.06 m/s, and  with an assumed right atrial pressure of 3 mmHg, the estimated right ventricular systolic pressure is 20.0 mmHg. Left Atrium: Left atrial size was normal in size. Right Atrium: Right atrial size was moderately dilated. Pericardium: There is no evidence of pericardial effusion. Mitral Valve: The mitral valve is normal in structure. Normal mobility of the mitral valve leaflets. Mild mitral valve regurgitation. No evidence of mitral valve stenosis. MV peak gradient, 4.7 mmHg. The mean mitral valve gradient is 2.0 mmHg. Tricuspid Valve: The tricuspid valve is normal in structure. Tricuspid valve regurgitation is trivial. No evidence of tricuspid stenosis. Aortic Valve: The aortic valve is tricuspid. Aortic valve regurgitation is not visualized. No aortic stenosis is present. Aortic valve mean gradient measures 3.0 mmHg. Aortic valve peak gradient measures 4.8 mmHg. Aortic valve area, by VTI measures 3.20 cm. Pulmonic Valve: The pulmonic valve was normal in structure. Pulmonic valve regurgitation is not visualized. No evidence of pulmonic stenosis. Aorta: The aortic root is normal in size and structure. Venous: The inferior vena cava is normal in size with greater than 50% respiratory variability, suggesting right atrial pressure of 3 mmHg. IAS/Shunts: No atrial level shunt detected by color flow Doppler.  LEFT VENTRICLE PLAX 2D LVIDd:         3.61 cm      Diastology LVIDs:         2.71 cm      LV e' lateral: 6.00 cm/s LV PW:         1.71 cm      LV e' medial:  3.83 cm/s LV IVS:  2.11 cm LVOT diam:     2.10 cm LV SV:         63 LV SV Index:   28 LVOT Area:     3.46 cm  LV Volumes (MOD) LV vol d, MOD A2C: 84.8  ml LV vol d, MOD A4C: 121.0 ml LV vol s, MOD A2C: 70.0 ml LV vol s, MOD A4C: 80.2 ml LV SV MOD A2C:     14.8 ml LV SV MOD A4C:     121.0 ml LV SV MOD BP:      26.0 ml RIGHT VENTRICLE            IVC RV Basal diam:  3.08 cm    IVC diam: 1.51 cm RV S prime:     9.75 cm/s TAPSE (M-mode): 1.4 cm LEFT ATRIUM             Index       RIGHT ATRIUM           Index LA diam:        4.00 cm 1.77 cm/m  RA Area:     23.40 cm LA Vol (A2C):   59.0 ml 26.17 ml/m RA Volume:   78.30 ml  34.73 ml/m LA Vol (A4C):   53.6 ml 23.78 ml/m LA Biplane Vol: 61.2 ml 27.15 ml/m  AORTIC VALVE AV Area (Vmax):    3.65 cm AV Area (Vmean):   3.27 cm AV Area (VTI):     3.20 cm AV Vmax:           110.00 cm/s AV Vmean:          81.200 cm/s AV VTI:            0.196 m AV Peak Grad:      4.8 mmHg AV Mean Grad:      3.0 mmHg LVOT Vmax:         116.00 cm/s LVOT Vmean:        76.600 cm/s LVOT VTI:          0.181 m LVOT/AV VTI ratio: 0.92  AORTA Ao Root diam: 3.10 cm MITRAL VALVE             TRICUSPID VALVE MV Area (PHT): 3.28 cm  TR Peak grad:   17.0 mmHg MV Peak grad:  4.7 mmHg  TR Vmax:        206.00 cm/s MV Mean grad:  2.0 mmHg MV Vmax:       1.08 m/s  SHUNTS MV Vmean:      67.5 cm/s Systemic VTI:  0.18 m                          Systemic Diam: 2.10 cm Skeet Latch MD Electronically signed by Skeet Latch MD Signature Date/Time: 07/26/2019/2:51:39 PM    Final    CT HEAD CODE STROKE WO CONTRAST  Result Date: 07/26/2019 CLINICAL DATA:  Code stroke. Initial evaluation for acute left-sided weakness, numbness, possible stroke. EXAM: CT HEAD WITHOUT CONTRAST TECHNIQUE: Contiguous axial images were obtained from the base of the skull through the vertex without intravenous contrast. COMPARISON:  None available. FINDINGS: Brain: Cerebral volume within normal limits for age. No acute intracranial hemorrhage. No acute large vessel territory infarct. No mass lesion, mass effect, or midline shift. No hydrocephalus. No extra-axial fluid collection.  Vascular: No hyperdense vessel. Scattered vascular calcifications noted within the carotid siphons. Punctate hyperdensity seen on a single slice at the right  sylvian fissure favored to reflect a small focal plaque (series 3, image 20). Skull: Scalp soft tissues within normal limits.  Calvarium intact. Sinuses/Orbits: Globes and orbital soft tissues demonstrate no acute finding. Chronic mucoperiosteal thickening noted throughout the paranasal sinuses. Mastoid air cells are clear. Other: None. ASPECTS [facility] Stroke Program Early CT Score) - Ganglionic level infarction (caudate, lentiform nuclei, internal capsule, insula, M1-M3 cortex): 7 - Supraganglionic infarction (M4-M6 cortex): 3 Total score (0-10 with 10 being normal): 10 IMPRESSION: 1. No acute intracranial infarct or other abnormality. 2. ASPECTS is 10. These results were communicated to Dr. Wilford Corner at 3:36 amon 3/11/2021by text page via the [facility] messaging system. Electronically Signed   By: Rise Mu M.D.   On: 07/26/2019 03:39    PHYSICAL EXAM  Temp:  [98 F (36.7 C)-98.7 F (37.1 C)] 98 F (36.7 C) (03/11 1936) Pulse Rate:  [83-101] 86 (03/11 1936) Resp:  [8-28] 18 (03/11 1936) BP: (103-135)/(53-90) 121/76 (03/11 1936) SpO2:  [90 %-100 %] 100 % (03/11 1936) Weight:  [123.4 kg] 123.4 kg (03/11 0406)  General - Well nourished, well developed, in no apparent distress.  Ophthalmologic - fundi not visualized due to noncooperation.  Cardiovascular - Regular rhythm and rate.  Mental Status -  Level of arousal and orientation to time, place, and person were intact. Language including expression, naming, repetition, comprehension was assessed and found intact. Mild dysarthria  Cranial Nerves II - XII - II - Visual field intact OU. III, IV, VI - Extraocular movements intact OD, left eye chronic right gaze palsy, left corneal implant present. V - Facial sensation diminished on the left. VII - right mild facial droop, ?  chronic. VIII - Hearing & vestibular intact bilaterally. X - Palate elevates symmetrically. Mild dysarthria XI - Chin turning & shoulder shrug intact bilaterally. XII - Tongue protrusion intact.  Motor Strength - The patient's strength was normal in RUE and RLE, however, LUE 4/5 proximal and distally, LLE 4+/5 proximal and distally. Bulk was normal and fasciculations were absent.   Motor Tone - Muscle tone was assessed at the neck and appendages and was normal.  Reflexes - The patient's reflexes were symmetrical in all extremities and he had no pathological reflexes.  Sensory - Light touch, temperature/pinprick were assessed and were diminished on the left.    Coordination - The patient had normal movements in the hands with no ataxia or dysmetria.  Tremor was absent.  Gait and Station - deferred.   ASSESSMENT/PLAN Philip Gregory is a 69 y.o. male with history of anemia, diabetes, hyperlipidemia, hypertension, coronary artery disease, gastroesophageal reflux disease and morbid obesity presenting with L sided weakness and hemisensory deficit.   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 ASPECTS 10.     CTA head & neck distal R M3 branch occlusion. R VA origin 80% stenosis. L VA hypoplastic w/ occlusion in neck and reocclusion V3. Mild to mod atherosclerosis throughout.   CT perfusion 12cc posterior R frontal lobe MCA infarct. 22cc penumbra. 10cc mismatch.   MRI  R parietal lobe infarct   2D Echo EF 50-55%. No source of embolus   LE venous doppler pending  Consider loop recorder to rule out afib if above work up unrevealing  LDL 70  HgbA1c 6.0  Lovenox 40 mg sq daily for VTE prophylaxis  aspirin 81 mg daily prior to admission, now on aspirin 325 mg daily and clopidogrel 75 mg  daily. Given large vessel disease, continue DAPT x 3 months then plavix alone.    Therapy recommendations:   pending  Disposition:  pending  Neurologic worsening  NIHSS 5->6  BP 110s -> Bedrest for 24h  IVF bolus  Albumin q6h x 4  Orthostatic vital with PT/OT  Hypertension  Stable . Permissive hypertension (OK if < 220/120) but gradually normalize in 5-7 days . Long-term BP goal normotensive . Avoid low BP . Orthostatic vital pending in am  Hyperlipidemia  Home meds:  crestor 20  Now on lipitor 80  LDL 70, goal < 70  Continue statin at discharge  Diabetes type II Controlled  HgbA1c 6.0, goal < 7.0  CBGs  SSI  Close PCP follow up  Other Stroke Risk Factors  Advanced age  Former Cigarette smoker, quit 1995  Substance abuse - marijuana -  UDS:  THC POSITIVE. Patient advised to stop using due to stroke risk.  Morbid Obesity, Body mass index is 45.26 kg/m., recommend weight loss, diet and exercise as appropriate   Coronary artery disease  Other Active Problems    Hospital day # 0  I spent  35 minutes in total face-to-face time with the patient, more than 50% of which was spent in counseling and coordination of care, reviewing test results, images and medication, and discussing the diagnosis of stroke, worsening neuro symptoms, right MCA M3 occlusion, treatment plan and potential prognosis. This patient's care requiresreview of multiple databases, neurological assessment, discussion with family, other specialists and medical decision making of high complexity.  Marvel Plan, MD PhD Stroke Neurology 07/26/2019 7:59 PM  To contact Stroke Continuity provider, please refer to [email or web address]. After hours, contact General Neurology

## 2019-07-26 NOTE — ED Notes (Signed)
Transported to MRI

## 2019-07-26 NOTE — ED Triage Notes (Signed)
  Patient BIB EMS for L sided weakness that was noticed upon waking up.  Patient went to bed around 2030 and woke at 0200 stating he could not move his L arm or leg.  Called code stroke.  Patient to CT 2 on arrival.  Initial NIH 5

## 2019-07-27 ENCOUNTER — Ambulatory Visit (HOSPITAL_BASED_OUTPATIENT_CLINIC_OR_DEPARTMENT_OTHER): Payer: Medicare Other

## 2019-07-27 ENCOUNTER — Encounter (HOSPITAL_COMMUNITY)
Admission: EM | Disposition: A | Discharge status: Home / Self Care | Payer: Self-pay | Source: Home / Self Care | Attending: Emergency Medicine

## 2019-07-27 DIAGNOSIS — E7800 Pure hypercholesterolemia, unspecified: Secondary | ICD-10-CM | POA: Diagnosis not present

## 2019-07-27 DIAGNOSIS — I10 Essential (primary) hypertension: Secondary | ICD-10-CM

## 2019-07-27 DIAGNOSIS — I639 Cerebral infarction, unspecified: Secondary | ICD-10-CM | POA: Diagnosis not present

## 2019-07-27 DIAGNOSIS — I6389 Other cerebral infarction: Secondary | ICD-10-CM

## 2019-07-27 DIAGNOSIS — I63511 Cerebral infarction due to unspecified occlusion or stenosis of right middle cerebral artery: Secondary | ICD-10-CM | POA: Diagnosis not present

## 2019-07-27 DIAGNOSIS — I2510 Atherosclerotic heart disease of native coronary artery without angina pectoris: Secondary | ICD-10-CM | POA: Diagnosis not present

## 2019-07-27 HISTORY — PX: LOOP RECORDER INSERTION: EP1214

## 2019-07-27 LAB — HIV ANTIBODY (ROUTINE TESTING W REFLEX): HIV Screen 4th Generation wRfx: NONREACTIVE

## 2019-07-27 SURGERY — LOOP RECORDER INSERTION

## 2019-07-27 MED ORDER — MIDODRINE HCL 5 MG PO TABS
5.0000 mg | ORAL_TABLET | Freq: Three times a day (TID) | ORAL | Status: DC
  Administered 2019-07-27 (×2): 5 mg via ORAL
  Filled 2019-07-27 (×2): qty 1

## 2019-07-27 MED ORDER — LIDOCAINE-EPINEPHRINE 1 %-1:100000 IJ SOLN
INTRAMUSCULAR | Status: AC
  Filled 2019-07-27: qty 1

## 2019-07-27 MED ORDER — MIDODRINE HCL 5 MG PO TABS: 5.0000 mg | ORAL_TABLET | Freq: Three times a day (TID) | ORAL | 0 refills | Status: AC

## 2019-07-27 MED ORDER — ASPIRIN 325 MG PO TABS: 325.0000 mg | ORAL_TABLET | Freq: Every day | ORAL | Status: AC

## 2019-07-27 MED ORDER — ATORVASTATIN CALCIUM 80 MG PO TABS: 80.0000 mg | ORAL_TABLET | Freq: Every day | ORAL | 0 refills | Status: AC

## 2019-07-27 MED ORDER — LIDOCAINE-EPINEPHRINE 1 %-1:100000 IJ SOLN
INTRAMUSCULAR | Status: DC | PRN
  Administered 2019-07-27: 30 mL

## 2019-07-27 MED ORDER — CLOPIDOGREL BISULFATE 75 MG PO TABS: 75.0000 mg | ORAL_TABLET | Freq: Every day | ORAL | 0 refills | Status: AC

## 2019-07-27 SURGICAL SUPPLY — 2 items
MONITOR REVEAL LINQ II (Prosthesis & Implant Heart) ×2 IMPLANT
PACK LOOP INSERTION (CUSTOM PROCEDURE TRAY) ×3 IMPLANT

## 2019-07-27 NOTE — Progress Notes (Signed)
Pt discharge education and instructions completed with pt and pt voices understanding and denies any questions. Pt IV and telemetry removed; pt discharge home with family to transport off to disposition. Pt to pick up electronically sent prescriptions from preferred pharmacy on file. Pt transported off unit via wheelchair with belongings to the side. Dionne Bucy RN

## 2019-07-27 NOTE — Evaluation (Signed)
Occupational Therapy Evaluation Patient Details Name: Philip Gregory MRN: [id number] DOB: 09-10-1950 Today's Date: 07/27/2019    History of Present Illness Pt is a 69 year old man admitted 07/26/19 with L sided weakness, + R parietal lobe infarct. PMH: morbid obesity, anemia, DM, HLD, HTN, CAD.   Clinical Impression   Pt was functioning independently with a cane and sat on a shower seat to bathe. He lives alone with intermittent assist of his ex wife and nephew, but can arrange for a nephew to stay with him when he returns home. Pt presents with decreased sensation, impaired proximal strength and significant incoordination of L UE. He fatigues easily with mobility. Pt is overall functioning at a min to min guard assist level. Recommending HHOT.    Follow Up Recommendations  Home health OT;Supervision/Assistance - 24 hour(initially)    Equipment Recommendations  None recommended by OT    Recommendations for Other Services       Precautions / Restrictions Precautions Precautions: Fall Precaution Comments: L UE with impaired sensation Restrictions Weight Bearing Restrictions: No      Mobility Bed Mobility               General bed mobility comments: pt seated at EOB upon arrival  Transfers Overall transfer level: Needs assistance Equipment used: Straight cane Transfers: Sit to/from Stand Sit to Stand: Min guard              Balance Overall balance assessment: Needs assistance   Sitting balance-Leahy Scale: Good       Standing balance-Leahy Scale: Fair                             ADL either performed or assessed with clinical judgement   ADL Overall ADL's : Needs assistance/impaired Eating/Feeding: Minimal assistance;Sitting Eating/Feeding Details (indicate cue type and reason): assist to open containers Grooming: Minimal assistance;Standing   Upper Body Bathing: Minimal assistance;Sitting   Lower Body Bathing: Minimal assistance;Sit to/from  stand   Upper Body Dressing : Minimal assistance;Sitting   Lower Body Dressing: Minimal assistance;Sit to/from stand   Toilet Transfer: Min guard;Ambulation   Toileting- Clothing Manipulation and Hygiene: Min guard;Sit to/from stand       Functional mobility during ADLs: Min guard;Cane General ADL Comments: pt fatigues easily     Vision Baseline Vision/History: Wears glasses Patient Visual Report: No change from baseline       Perception     Praxis      Pertinent Vitals/Pain Pain Assessment: No/denies pain     Hand Dominance Right   Extremity/Trunk Assessment Upper Extremity Assessment Upper Extremity Assessment: LUE deficits/detail LUE Deficits / Details: shoulder 4/5, 5/5 elbow to hand  LUE Sensation: decreased light touch(negative for drift) LUE Coordination: decreased fine motor;decreased gross motor   Lower Extremity Assessment Lower Extremity Assessment: Defer to PT evaluation   Cervical / Trunk Assessment Cervical / Trunk Assessment: Other exceptions Cervical / Trunk Exceptions: obesity   Communication Communication Communication: No difficulties   Cognition Arousal/Alertness: Awake/alert Behavior During Therapy: WFL for tasks assessed/performed Overall Cognitive Status: Within Functional Limits for tasks assessed                                 General Comments: aware of deficits in L UE, but not compensating well   General Comments       Exercises     Shoulder Instructions  Home Living Family/patient expects to be discharged to:: Private residence Living Arrangements: Alone Available Help at Discharge: Family;Friend(s);Available PRN/intermittently(ex wife and nephews) Type of Home: Apartment Home Access: Stairs to enter Technical brewer of Steps: 5 Entrance Stairs-Rails: Right Home Layout: One level     Bathroom Shower/Tub: Teacher, early years/pre: Standard     Home Equipment: Marine scientist -  single point;Hand held shower head   Additional Comments: pt can get a nephew to stay with him      Prior Functioning/Environment Level of Independence: Independent with assistive device(s)        Comments: ambulates with a cane, "bad" knees, drives        OT Problem List: Decreased coordination      OT Treatment/Interventions: Self-care/ADL training;DME and/or AE instruction;Neuromuscular education;Patient/family education;Balance training;Therapeutic activities    OT Goals(Current goals can be found in the care plan section) Acute Rehab OT Goals Patient Stated Goal: to regain use of L UE OT Goal Formulation: With patient Time For Goal Achievement: 08/10/19 Potential to Achieve Goals: Good ADL Goals Pt Will Perform Grooming: with modified independence;standing Pt Will Perform Upper Body Dressing: with modified independence;sitting Pt Will Perform Lower Body Dressing: with modified independence;sit to/from stand Pt Will Transfer to Toilet: with modified independence;ambulating;regular height toilet Pt Will Perform Toileting - Clothing Manipulation and hygiene: with modified independence;sit to/from stand Pt/caregiver will Perform Home Exercise Program: Left upper extremity;With theraputty;Independently(coordination)  OT Frequency: Min 2X/week   Barriers to D/C:            Co-evaluation              AM-PAC OT "6 Clicks" Daily Activity     Outcome Measure Help from another person eating meals?: A Little Help from another person taking care of personal grooming?: A Little Help from another person toileting, which includes using toliet, bedpan, or urinal?: A Little Help from another person bathing (including washing, rinsing, drying)?: A Little Help from another person to put on and taking off regular upper body clothing?: A Little Help from another person to put on and taking off regular lower body clothing?: A Little 6 Click Score: 18   End of Session Equipment  Utilized During Treatment: Gait belt  Activity Tolerance: Patient tolerated treatment well Patient left: in chair;with call bell/phone within reach  OT Visit Diagnosis: Other abnormalities of gait and mobility (R26.89);Unsteadiness on feet (R26.81);Hemiplegia and hemiparesis Hemiplegia - Right/Left: Left Hemiplegia - dominant/non-dominant: Non-Dominant Hemiplegia - caused by: Cerebral infarction                Time: 6045-4098 OT Time Calculation (min): 15 min Charges:  OT General Charges $OT Visit: 1 Visit OT Evaluation $OT Eval Moderate Complexity: 1 Mod  Philip Gregory, OTR/L Acute Rehabilitation Services Pager: [phone] Office: [phone]  Philip Gregory 07/27/2019, 9:42 AM

## 2019-07-27 NOTE — Discharge Instructions (Signed)
Aspirin plus Plavix together for 3 months then Plavix alone

## 2019-07-27 NOTE — Progress Notes (Signed)
STROKE TEAM PROGRESS NOTE   INTERVAL HISTORY Pt sitting in chair, just come back from loop recorder placement. He said he was a little nausous due to acid reflex. Left arm mild weakness at deltoid, left LE strong. PT/OT recommend home PT/OT. BP on the low end, put on midodrine 5mg  tid.   Vitals:   07/26/19 1936 07/26/19 2359 07/27/19 0431 07/27/19 0833  BP: 121/76 106/74 106/73 129/84  Pulse: 86 88 88 87  Resp: 18 18 18 20   Temp: 98 F (36.7 C) 98 F (36.7 C) 97.9 F (36.6 C) 98.1 F (36.7 C)  TempSrc: Oral Oral Oral Oral  SpO2: 100% 100% 99% 99%  Weight:      Height:        CBC:  Recent Labs  Lab 07/26/19 0318 07/26/19 0328  WBC 10.2  --   NEUTROABS 7.2  --   HGB 13.7 15.0  HCT 42.7 44.0  MCV 90.1  --   PLT 288  --     Basic Metabolic Panel:  Recent Labs  Lab 07/26/19 0318 07/26/19 0328  NA 139 140  K 3.9 4.0  CL 105 103  CO2 24  --   GLUCOSE 98 92  BUN 16 19  CREATININE 1.27* 1.20  CALCIUM 9.0  --    Lipid Panel:     Component Value Date/Time   CHOL 123 07/26/2019 0727   TRIG 39 07/26/2019 0727   HDL 45 07/26/2019 0727   CHOLHDL 2.7 07/26/2019 0727   VLDL 8 07/26/2019 0727   LDLCALC 70 07/26/2019 0727   HgbA1c:  Lab Results  Component Value Date   HGBA1C 6.0 (H) 07/26/2019   Urine Drug Screen:     Component Value Date/Time   LABOPIA NONE DETECTED 07/26/2019 0846   COCAINSCRNUR NONE DETECTED 07/26/2019 0846   LABBENZ NONE DETECTED 07/26/2019 0846   AMPHETMU NONE DETECTED 07/26/2019 0846   THCU POSITIVE (A) 07/26/2019 0846   LABBARB NONE DETECTED 07/26/2019 0846    Alcohol Level No results found for: ETH  IMAGING past 24 hours MR BRAIN WO CONTRAST  Result Date: 07/26/2019 CLINICAL DATA:  Stroke follow-up. EXAM: MRI HEAD WITHOUT CONTRAST TECHNIQUE: Multiplanar, multiecho pulse sequences of the brain and surrounding structures were obtained without intravenous contrast. COMPARISON:  CTA head and CT perfusion earlier today FINDINGS: Brain:  Acute infarct right parietal lobe. This appears to involve primarily the sensory cortex but also some of the motor cortex. No associated hemorrhage Ventricle size normal. Negative for mass lesion. No significant chronic ischemia. Vascular: Normal arterial flow voids. Skull and upper cervical spine: Negative Sinuses/Orbits: Mild mucosal edema paranasal sinuses. Disconjugate gaze. No orbital mass. Other: None IMPRESSION: Acute infarct right parietal lobe which appears to predominantly involve the sensory cortex with a smaller amount of motor cortex involvement. No associated hemorrhage. No chronic ischemia. Electronically Signed   By: 09/25/2019 M.D.   On: 07/26/2019 10:01   ECHOCARDIOGRAM COMPLETE  Result Date: 07/26/2019    ECHOCARDIOGRAM REPORT   Patient Name:   [name] Date of Exam: 07/26/2019 Medical Rec #:  [facility]   Height:       65.0 in Accession #:    09/25/2019  Weight:       272.0 lb Date of Birth:  [birth year]  BSA:          2.254 m Patient Age:    68 years    BP:           115/73 mmHg Patient Gender:  M           HR:           91 bpm. Exam Location:  Inpatient Procedure: 2D Echo, Cardiac Doppler and Color Doppler Indications:    Stroke 434.91/I163.9  History:        Patient has no prior history of Echocardiogram examinations.                 CAD; Risk Factors:Hypertension and Dyslipidemia.  Sonographer:    Clayton Lefort RDCS (AE) Referring Phys: [id number] TIMOTHY S OPYD  Sonographer Comments: Patient is morbidly obese. IMPRESSIONS  1. Left ventricular ejection fraction, by estimation, is 50 to 55%. The left ventricle has low normal function. The left ventricle has no regional wall motion abnormalities. There is severe concentric left ventricular hypertrophy. Left ventricular diastolic parameters are consistent with Grade II diastolic dysfunction (pseudonormalization).  2. Right ventricular systolic function is normal. The right ventricular size is normal. There is normal pulmonary artery systolic  pressure.  3. Right atrial size was moderately dilated.  4. The mitral valve is normal in structure. Mild mitral valve regurgitation. No evidence of mitral stenosis.  5. The aortic valve is tricuspid. Aortic valve regurgitation is not visualized. No aortic stenosis is present.  6. The inferior vena cava is normal in size with greater than 50% respiratory variability, suggesting right atrial pressure of 3 mmHg. FINDINGS  Left Ventricle: Left ventricular ejection fraction, by estimation, is 50 to 55%. The left ventricle has low normal function. The left ventricle has no regional wall motion abnormalities. The left ventricular internal cavity size was normal in size. There is severe concentric left ventricular hypertrophy. Left ventricular diastolic parameters are consistent with Grade II diastolic dysfunction (pseudonormalization). Right Ventricle: The right ventricular size is normal. No increase in right ventricular wall thickness. Right ventricular systolic function is normal. There is normal pulmonary artery systolic pressure. The tricuspid regurgitant velocity is 2.06 m/s, and  with an assumed right atrial pressure of 3 mmHg, the estimated right ventricular systolic pressure is 23.7 mmHg. Left Atrium: Left atrial size was normal in size. Right Atrium: Right atrial size was moderately dilated. Pericardium: There is no evidence of pericardial effusion. Mitral Valve: The mitral valve is normal in structure. Normal mobility of the mitral valve leaflets. Mild mitral valve regurgitation. No evidence of mitral valve stenosis. MV peak gradient, 4.7 mmHg. The mean mitral valve gradient is 2.0 mmHg. Tricuspid Valve: The tricuspid valve is normal in structure. Tricuspid valve regurgitation is trivial. No evidence of tricuspid stenosis. Aortic Valve: The aortic valve is tricuspid. Aortic valve regurgitation is not visualized. No aortic stenosis is present. Aortic valve mean gradient measures 3.0 mmHg. Aortic valve peak  gradient measures 4.8 mmHg. Aortic valve area, by VTI measures 3.20 cm. Pulmonic Valve: The pulmonic valve was normal in structure. Pulmonic valve regurgitation is not visualized. No evidence of pulmonic stenosis. Aorta: The aortic root is normal in size and structure. Venous: The inferior vena cava is normal in size with greater than 50% respiratory variability, suggesting right atrial pressure of 3 mmHg. IAS/Shunts: No atrial level shunt detected by color flow Doppler.  LEFT VENTRICLE PLAX 2D LVIDd:         3.61 cm      Diastology LVIDs:         2.71 cm      LV e' lateral: 6.00 cm/s LV PW:         1.71 cm      LV  e' medial:  3.83 cm/s LV IVS:        2.11 cm LVOT diam:     2.10 cm LV SV:         63 LV SV Index:   28 LVOT Area:     3.46 cm  LV Volumes (MOD) LV vol d, MOD A2C: 84.8 ml LV vol d, MOD A4C: 121.0 ml LV vol s, MOD A2C: 70.0 ml LV vol s, MOD A4C: 80.2 ml LV SV MOD A2C:     14.8 ml LV SV MOD A4C:     121.0 ml LV SV MOD BP:      26.0 ml RIGHT VENTRICLE            IVC RV Basal diam:  3.08 cm    IVC diam: 1.51 cm RV S prime:     9.75 cm/s TAPSE (M-mode): 1.4 cm LEFT ATRIUM             Index       RIGHT ATRIUM           Index LA diam:        4.00 cm 1.77 cm/m  RA Area:     23.40 cm LA Vol (A2C):   59.0 ml 26.17 ml/m RA Volume:   78.30 ml  34.73 ml/m LA Vol (A4C):   53.6 ml 23.78 ml/m LA Biplane Vol: 61.2 ml 27.15 ml/m  AORTIC VALVE AV Area (Vmax):    3.65 cm AV Area (Vmean):   3.27 cm AV Area (VTI):     3.20 cm AV Vmax:           110.00 cm/s AV Vmean:          81.200 cm/s AV VTI:            0.196 m AV Peak Grad:      4.8 mmHg AV Mean Grad:      3.0 mmHg LVOT Vmax:         116.00 cm/s LVOT Vmean:        76.600 cm/s LVOT VTI:          0.181 m LVOT/AV VTI ratio: 0.92  AORTA Ao Root diam: 3.10 cm MITRAL VALVE             TRICUSPID VALVE MV Area (PHT): 3.28 cm  TR Peak grad:   17.0 mmHg MV Peak grad:  4.7 mmHg  TR Vmax:        206.00 cm/s MV Mean grad:  2.0 mmHg MV Vmax:       1.08 m/s  SHUNTS MV Vmean:       67.5 cm/s Systemic VTI:  0.18 m                          Systemic Diam: 2.10 cm Chilton Siiffany [place] MD Electronically signed by Chilton Siiffany [place] MD Signature Date/Time: 07/26/2019/2:51:39 PM    Final     PHYSICAL EXAM   Temp:  [97.9 F (36.6 C)-98.7 F (37.1 C)] 98.1 F (36.7 C) (03/12 0833) Pulse Rate:  [83-96] 87 (03/12 0833) Resp:  [13-25] 20 (03/12 0833) BP: (106-135)/(53-90) 129/84 (03/12 0833) SpO2:  [96 %-100 %] 99 % (03/12 0833)  General - Well nourished, well developed, in no apparent distress.  Ophthalmologic - fundi not visualized due to noncooperation.  Cardiovascular - Regular rhythm and rate.  Mental Status -  Level of arousal and orientation to time, place, and person were intact. Language including  expression, naming, repetition, comprehension was assessed and found intact. Mild dysarthria  Cranial Nerves II - XII - II - Visual field intact OU. III, IV, VI - Extraocular movements intact OD, left eye chronic right gaze palsy, left corneal implant present. V - Facial sensation decreased on the left. VII - right mild facial droop, ? chronic. VIII - Hearing & vestibular intact bilaterally. X - Palate elevates symmetrically. Mild dysarthria XI - Chin turning & shoulder shrug intact bilaterally. XII - Tongue protrusion intact.  Motor Strength - The patient's strength was normal in RUE and RLE, however, LUE 4-/5 deltoid but 5/5 bicep and tricep with finger grip, LLE 5/5 proximal and distally. Bulk was normal and fasciculations were absent.   Motor Tone - Muscle tone was assessed at the neck and appendages and was normal.  Reflexes - The patient's reflexes were symmetrical in all extremities and he had no pathological reflexes.  Sensory - Light touch, temperature/pinprick were assessed and were decreased on the left.    Coordination - The patient had normal movements in the hands with no ataxia or dysmetria.  Tremor was absent.  Gait and Station -  deferred.   ASSESSMENT/PLAN Philip Gregory is a 69 y.o. male with history of anemia, diabetes, hyperlipidemia, hypertension, coronary artery disease, gastroesophageal reflux disease and morbid obesity presenting with L sided weakness and hemisensory deficit.   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 ASPECTS 10.     CTA head & neck distal R M3 branch occlusion. R VA origin 80% stenosis. L VA hypoplastic w/ occlusion in neck and reocclusion V3. Mild to mod atherosclerosis throughout.   CT perfusion 12cc posterior R frontal lobe MCA infarct. 22cc penumbra. 10cc mismatch.   MRI  R parietal lobe infarct   2D Echo EF 50-55%. No source of embolus   LE venous doppler no DVT   Loop recorder placed to rule out afib  LDL 70  HgbA1c 6.0  Lovenox 40 mg sq daily for VTE prophylaxis  aspirin 81 mg daily prior to admission, now on aspirin 325 mg daily and clopidogrel 75 mg daily. Given large vessel disease, continue DAPT x 3 months then plavix alone.    Therapy recommendations:  HH PT OT  Disposition:  Likely home   Neurologic worsening, resolved  NIHSS 5->6  BP 110s -> Bedrest for 24h -> now off  IVF bolus  Albumin q6h x 4 - completed  On midodrine 5mg  tid now   Hypertension  Stable . Gradually normalize in 5-7 days . Long-term BP goal normotensive . Avoid low BP  Added midodrine 5mg  tid  Hyperlipidemia  Home meds:  crestor 20  Now on lipitor 80  LDL 70, goal < 70  Continue statin at discharge  Diabetes type II Controlled  HgbA1c 6.0, goal < 7.0  CBGs  SSI  Close PCP follow up  Other Stroke Risk Factors  Advanced age  Former Cigarette smoker, quit 1995  Substance abuse - marijuana -  UDS:  THC POSITIVE. Patient advised to stop using due to stroke risk.  Morbid Obesity, Body mass index is 45.26 kg/m., recommend weight loss, diet and exercise as appropriate   Coronary artery  disease - s/p loop  Other Active Problems    Hospital day # 0  Neurology will sign off. Please call with questions. Pt will follow up with stroke clinic NP at [facility] in about 4 weeks. Thanks for  the consult.   Marvel Plan, MD PhD Stroke Neurology 07/27/2019 8:59 AM  To contact Stroke Continuity provider, please refer to [email or web address]. After hours, contact General Neurology

## 2019-07-27 NOTE — Progress Notes (Signed)
OT Cancellation Note  Patient Details Name: Harbert Fitterer MRN: [id number] DOB: February 20, 1951   Cancelled Treatment:    Reason Eval/Treat Not Completed: Active bedrest order. Will send message to MD.  Evern Bio 07/27/2019, 8:31 AM  Martie Round, OTR/L Acute Rehabilitation Services Pager: [phone] Office: [phone]

## 2019-07-27 NOTE — Interval H&P Note (Signed)
History and Physical Interval Note:  07/27/2019 2:18 PM  Philip Gregory  has presented today for surgery, with the diagnosis of stroke.  The various methods of treatment have been discussed with the patient and family. After consideration of risks, benefits and other options for treatment, the patient has consented to  Procedure(s): LOOP RECORDER INSERTION (N/A) as a surgical intervention.  The patient's history has been reviewed, patient examined, no change in status, stable for surgery.  I have reviewed the patient's chart and labs.  Questions were answered to the patient's satisfaction.     Hillis Range

## 2019-07-27 NOTE — Progress Notes (Signed)
Patient going downstairs for loop recorder JV

## 2019-07-27 NOTE — Progress Notes (Signed)
Lower extremity venous has been completed.   Preliminary results in CV Proc.   Blanch Media 07/27/2019 11:24 AM

## 2019-07-27 NOTE — Consult Note (Addendum)
ELECTROPHYSIOLOGY CONSULT NOTE  Patient ID: Philip Gregory MRN: [id number], DOB/AGE: Sep 04, 1950   Admit date: 07/26/2019 Date of Consult: 07/27/2019  Primary Physician: System, Pcp Not In Primary Cardiologist: [place] Reason for Consultation: Cryptogenic stroke; recommendations regarding Implantable Loop Recorder  History of Present Illness EP has been asked to evaluate [name] for placement of an implantable loop recorder to monitor for atrial fibrillation by Dr Roda Shutters.  The patient was admitted on 07/26/2019 with left sided weakness and hemisensory deficit.  Imaging demonstrated R parietal lobe infarct felt to be embolic 2/2 unknown source.  he has undergone workup for stroke including echocardiogram and carotid imaging.  The patient has been monitored on telemetry which has demonstrated sinus rhythm with no arrhythmias.    Echocardiogram this admission demonstrated EF 50-55%, LA 40.  Lab work is reviewed.  Prior to admission, the patient denies chest pain, shortness of breath, dizziness, palpitations, or syncope.  They are recovering from their stroke with plans to return home at discharge.   Past Medical History:  Diagnosis Date   CAD (coronary artery disease) 07/26/2019   Essential hypertension 07/26/2019   Hyperlipidemia 07/26/2019     Surgical History: History reviewed. No pertinent surgical history.   Medications Prior to Admission  Medication Sig Dispense Refill Last Dose   aspirin EC 81 MG tablet Take 81 mg by mouth daily.   07/25/2019 at Unknown time   hydrOXYzine (ATARAX/VISTARIL) 25 MG tablet Take 25 mg by mouth 3 (three) times daily as needed for anxiety.   07/25/2019 at Unknown time   lisinopril-hydrochlorothiazide (ZESTORETIC) 20-12.5 MG tablet Take 1 tablet by mouth daily.   07/25/2019 at Unknown time   metoprolol succinate (TOPROL-XL) 50 MG 24 hr tablet Take 50 mg by mouth daily. Take with or immediately following a meal.   07/25/2019 at 0900   omeprazole  (PRILOSEC) 20 MG capsule Take 20 mg by mouth 2 (two) times daily before a meal.   07/25/2019 at Unknown time   rosuvastatin (CRESTOR) 20 MG tablet Take 20 mg by mouth daily.   07/25/2019 at Unknown time    Inpatient Medications:   aspirin  300 mg Rectal Daily   Or   aspirin  325 mg Oral Daily   atorvastatin  80 mg Oral q1800   clopidogrel  75 mg Oral Daily   enoxaparin (LOVENOX) injection  40 mg Subcutaneous Daily   midodrine  5 mg Oral TID WC   sodium chloride flush  3 mL Intravenous Once    Allergies: No Known Allergies  Social History   Socioeconomic History   Marital status: Single    Spouse name: Not on file   Number of children: Not on file   Years of education: Not on file   Highest education level: Not on file  Occupational History   Not on file  Tobacco Use   Smoking status: Former Smoker    Quit date: 1995    Years since quitting: 26.2   Smokeless tobacco: Never Used  Substance and Sexual Activity   Alcohol use: Yes    Alcohol/week: 2.0 standard drinks    Types: 2 Cans of beer per week   Drug use: Not on file   Sexual activity: Not on file  Other Topics Concern   Not on file  Social History Narrative   Not on file   Social Determinants of Health   Financial Resource Strain:    Difficulty of Paying Living Expenses:   Food Insecurity:  Worried About Programme researcher, broadcasting/film/video in the Last Year:    Barista in the Last Year:   Transportation Needs:    Freight forwarder (Medical):    Lack of Transportation (Non-Medical):   Physical Activity:    Days of Exercise per Week:    Minutes of Exercise per Session:   Stress:    Feeling of Stress :   Social Connections:    Frequency of Communication with Friends and Family:    Frequency of Social Gatherings with Friends and Family:    Attends Religious Services:    Active Member of Clubs or Organizations:    Attends Engineer, structural:    Marital Status:     Intimate Partner Violence:    Fear of Current or Ex-Partner:    Emotionally Abused:    Physically Abused:    Sexually Abused:      Family History  Problem Relation Age of Onset   Hypertension Mother    Diabetes Mother    Cancer Father    Hypertension Sister       Review of Systems: All other systems reviewed and are otherwise negative except as noted above.  Physical Exam: Vitals:   07/26/19 1936 07/26/19 2359 07/27/19 0431 07/27/19 0833  BP: 121/76 106/74 106/73 129/84  Pulse: 86 88 88 87  Resp: 18 18 18 20   Temp: 98 F (36.7 C) 98 F (36.7 C) 97.9 F (36.6 C) 98.1 F (36.7 C)  TempSrc: Oral Oral Oral Oral  SpO2: 100% 100% 99% 99%  Weight:      Height:        GEN- The patient is well appearing, alert and oriented x 3 today.   Head- normocephalic, atraumatic Eyes-  Sclera clear, conjunctiva pink Ears- hearing intact Oropharynx- clear Neck- supple Lungs- Clear to ausculation bilaterally, normal work of breathing Heart- Regular rate and rhythm, no murmurs, rubs or gallops  GI- soft, NT, ND, + BS Extremities- no clubbing, cyanosis, or edema MS- no significant deformity or atrophy Skin- no rash or lesion Psych- euthymic mood, full affect   Labs:   Lab Results  Component Value Date   WBC 10.2 07/26/2019   HGB 15.0 07/26/2019   HCT 44.0 07/26/2019   MCV 90.1 07/26/2019   PLT 288 07/26/2019    Recent Labs  Lab 07/26/19 0318 07/26/19 0318 07/26/19 0328  NA 139   < > 140  K 3.9   < > 4.0  CL 105   < > 103  CO2 24  --   --   BUN 16   < > 19  CREATININE 1.27*   < > 1.20  CALCIUM 9.0  --   --   PROT 6.8  --   --   BILITOT 0.6  --   --   ALKPHOS 98  --   --   ALT 18  --   --   AST 20  --   --   GLUCOSE 98   < > 92   < > = values in this interval not displayed.     Radiology/Studies: CT Code Stroke CTA Head W/WO contrast  Result Date: 07/26/2019 CLINICAL DATA:  Initial evaluation for acute left-sided weakness. EXAM: CT ANGIOGRAPHY HEAD  AND NECK CT PERFUSION BRAIN TECHNIQUE: Multidetector CT imaging of the head and neck was performed using the standard protocol during bolus administration of intravenous contrast. Multiplanar CT image reconstructions and MIPs were obtained to evaluate the vascular anatomy.  Carotid stenosis measurements (when applicable) are obtained utilizing NASCET criteria, using the distal internal carotid diameter as the denominator. Multiphase CT imaging of the brain was performed following IV bolus contrast injection. Subsequent parametric perfusion maps were calculated using RAPID software. CONTRAST:  50mL OMNIPAQUE IOHEXOL 350 MG/ML SOLN, 50mL OMNIPAQUE IOHEXOL 350 MG/ML SOLN COMPARISON:  Prior head CT from earlier the same day. FINDINGS: CTA NECK FINDINGS Aortic arch: Visualized aortic arch normal in caliber with normal 3 vessel morphology. Moderate atherosclerotic change about the arch and origin of the great vessels without hemodynamically significant stenosis. Visualized subclavian arteries patent without stenosis. Right carotid system: Right common carotid artery patent from its origin to the bifurcation without stenosis. Mild scattered mixed plaque about the right bifurcation/proximal right ICA without significant stenosis. Right ICA patent distally to the skull base without stenosis, dissection or occlusion. Left carotid system: Left common carotid artery patent from its origin to the bifurcation without significant stenosis. Mixed plaque about the left bifurcation/proximal left ICA without hemodynamically significant stenosis. Left ICA patent distally to the skull base without stenosis, dissection or occlusion. Vertebral arteries: Both vertebral arteries arise from the subclavian arteries. Focal plaque at the origin of the right vertebral artery with severe at least 80% ostial stenosis (series 8, image 276). Right vertebral artery otherwise widely patent within the neck. 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 Scant distal reconstitution at the left V3 segment, with attenuated flow as it courses into the cranial vault. 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 Skeleton: No acute osseous abnormality. No worrisome osseous lesions. Moderate to advanced multilevel cervical spondylosis, most notable at C5-6 and C6-7. Chronic grade 1 facet mediated anterolisthesis of C4 on C5. Other neck: No other acute soft tissue abnormality within the neck. No appreciable mass lesion or adenopathy. Upper chest: Mild ground-glass opacity with diffuse interlobular septal thickening seen within the visualized lungs, suggesting mild pulmonary interstitial edema. Visualized upper chest demonstrates no other acute finding. Review of the MIP images confirms the above findings CTA HEAD FINDINGS Anterior circulation: Petrous segments widely patent. Mild scattered atheromatous plaque within the cavernous/supraclinoid ICAs without significant stenosis. ICA termini well perfused. A1 segments patent bilaterally. Normal anterior communicating artery. Anterior cerebral arteries widely patent to their distal aspects. No M1 stenosis or occlusion. Normal MCA bifurcations. On the right, there is abrupt occlusion of the distal left M3 branch (series 8, image 78). Right MCA branches otherwise perfused. No large vessel occlusion seen on the left. Posterior circulation: Dominant right vertebral artery patent to the vertebrobasilar junction without stenosis. Hypoplastic left vertebral artery attenuated but patent as it courses into the cranial vault, and remains patent to the vertebrobasilar junction. Posterior inferior cerebral arteries patent bilaterally. Basilar diminutive but patent to its distal aspect without stenosis. Superior cerebral arteries patent bilaterally. Fetal type origin of both PCAs supplied via a robust posterior communicating arteries. Both PCAs well perfused  to their distal aspects without stenosis. Venous sinuses: Patent allowing for timing the contrast bolus. Anatomic variants: Fetal type origin of the PCAs with diminutive vertebrobasilar system. Review of the MIP images confirms the above findings CT Brain Perfusion Findings: ASPECTS: 10 CBF (<30%) Volume: 12mL Perfusion (Tmax>6.0s) volume: 22mL Mismatch Volume: 10mL Infarction Location:Acute core infarct seen involving the posterior right MCA distribution, posterior right region. Finding consistent with the right M3 occlusion. 10 cc surrounding ischemic penumbra. IMPRESSION: CTA HEAD AND NECK IMPRESSION: 1. Positive CTA with acute occlusion  of a distal right M3 branch. 2. 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 3. Mild-to-moderate atherosclerotic change elsewhere about the major arterial vasculature of the head and neck. No other hemodynamically significant or correctable stenosis. 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 22 cc ischemic penumbra, with mismatch volume of 10 cc. Critical Value/emergent results were called by telephone at the time of interpretation on 07/26/2019 at 3:50 am to provider [facility] , who verbally acknowledged these results. Electronically Signed   By: Rise Mu M.D.   On: 07/26/2019 04:42   CT Code Stroke CTA Neck W/WO contrast  Result Date: 07/26/2019 CLINICAL DATA:  Initial evaluation for acute left-sided weakness. EXAM: CT ANGIOGRAPHY HEAD AND NECK CT PERFUSION BRAIN TECHNIQUE: Multidetector CT imaging of the head and neck was performed using the standard protocol during bolus administration of intravenous contrast. Multiplanar CT image reconstructions and MIPs were obtained to evaluate the vascular anatomy.  Carotid stenosis measurements (when applicable) are obtained utilizing NASCET criteria, using the distal internal carotid diameter as the denominator. Multiphase CT imaging of the brain was performed following IV bolus contrast injection. Subsequent parametric perfusion maps were calculated using RAPID software. CONTRAST:  50mL OMNIPAQUE IOHEXOL 350 MG/ML SOLN, 50mL OMNIPAQUE IOHEXOL 350 MG/ML SOLN COMPARISON:  Prior head CT from earlier the same day. FINDINGS: CTA NECK FINDINGS Aortic arch: Visualized aortic arch normal in caliber with normal 3 vessel morphology. Moderate atherosclerotic change about the arch and origin of the great vessels without hemodynamically significant stenosis. Visualized subclavian arteries patent without stenosis. Right carotid system: Right common carotid artery patent from its origin to the bifurcation without stenosis. Mild scattered mixed plaque about the right bifurcation/proximal right ICA without significant stenosis. Right ICA patent distally to the skull base without stenosis, dissection or occlusion. Left carotid system: Left common carotid artery patent from its origin to the bifurcation without significant stenosis. Mixed plaque about the left bifurcation/proximal left ICA without hemodynamically significant stenosis. Left ICA patent distally to the skull base without stenosis, dissection or occlusion. Vertebral arteries: Both vertebral arteries arise from the subclavian arteries. Focal plaque at the origin of the right vertebral artery with severe at least 80% ostial stenosis (series 8, image 276). Right vertebral artery otherwise widely patent within the neck. 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 Scant distal reconstitution at the left V3 segment, with attenuated flow as it courses into the cranial vault. 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 Skeleton: No acute osseous  abnormality. No worrisome osseous lesions. Moderate to advanced multilevel cervical spondylosis, most notable at C5-6 and C6-7. Chronic grade 1 facet mediated anterolisthesis of C4 on C5. Other neck: No other acute soft tissue abnormality within the neck. No appreciable mass lesion or adenopathy. Upper chest: Mild ground-glass opacity with diffuse interlobular septal thickening seen within the visualized lungs, suggesting mild pulmonary interstitial edema. Visualized upper chest demonstrates no other acute finding. Review of the MIP images confirms the above findings CTA HEAD FINDINGS Anterior circulation: Petrous segments widely patent. Mild scattered atheromatous plaque within the cavernous/supraclinoid ICAs without significant stenosis. ICA termini well perfused. A1 segments patent bilaterally. Normal anterior communicating artery. Anterior cerebral arteries widely patent to their distal aspects. No M1 stenosis or occlusion. Normal  MCA bifurcations. On the right, there is abrupt occlusion of the distal left M3 branch (series 8, image 78). Right MCA branches otherwise perfused. No large vessel occlusion seen on the left. Posterior circulation: Dominant right vertebral artery patent to the vertebrobasilar junction without stenosis. Hypoplastic left vertebral artery attenuated but patent as it courses into the cranial vault, and remains patent to the vertebrobasilar junction. Posterior inferior cerebral arteries patent bilaterally. Basilar diminutive but patent to its distal aspect without stenosis. Superior cerebral arteries patent bilaterally. Fetal type origin of both PCAs supplied via a robust posterior communicating arteries. Both PCAs well perfused to their distal aspects without stenosis. Venous sinuses: Patent allowing for timing the contrast bolus. Anatomic variants: Fetal type origin of the PCAs with diminutive vertebrobasilar system. Review of the MIP images confirms the above findings CT Brain Perfusion  Findings: ASPECTS: 10 CBF (<30%) Volume: 2mL Perfusion (Tmax>6.0s) volume: 14mL Mismatch Volume: 62mL Infarction Location:Acute core infarct seen involving the posterior right MCA distribution, posterior right region. Finding consistent with the right M3 occlusion. 10 cc surrounding ischemic penumbra. IMPRESSION: CTA HEAD AND NECK IMPRESSION: 1. Positive CTA with acute occlusion of a distal right M3 branch. 2. 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 3. Mild-to-moderate atherosclerotic change elsewhere about the major arterial vasculature of the head and neck. No other hemodynamically significant or correctable stenosis. 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 22 cc ischemic penumbra, with mismatch volume of 10 cc. Critical Value/emergent results were called by telephone at the time of interpretation on 07/26/2019 at 3:50 am to provider [facility] , who verbally acknowledged these results. Electronically Signed   By: Rise Mu M.D.   On: 07/26/2019 04:42   MR BRAIN WO CONTRAST  Result Date: 07/26/2019 CLINICAL DATA:  Stroke follow-up. EXAM: MRI HEAD WITHOUT CONTRAST TECHNIQUE: Multiplanar, multiecho pulse sequences of the brain and surrounding structures were obtained without intravenous contrast. COMPARISON:  CTA head and CT perfusion earlier today FINDINGS: Brain: Acute infarct right parietal lobe. This appears to involve primarily the sensory cortex but also some of the motor cortex. No associated hemorrhage Ventricle size normal. Negative for mass lesion. No significant chronic ischemia. Vascular: Normal arterial flow voids. Skull and upper cervical spine: Negative Sinuses/Orbits: Mild mucosal edema paranasal sinuses. Disconjugate gaze. No  orbital mass. Other: None IMPRESSION: Acute infarct right parietal lobe which appears to predominantly involve the sensory cortex with a smaller amount of motor cortex involvement. No associated hemorrhage. No chronic ischemia. Electronically Signed   By: Marlan Palau M.D.   On: 07/26/2019 10:01   CT Code Stroke Cerebral Perfusion with contrast  Result Date: 07/26/2019 CLINICAL DATA:  Initial evaluation for acute left-sided weakness. EXAM: CT ANGIOGRAPHY HEAD AND NECK CT PERFUSION BRAIN TECHNIQUE: Multidetector CT imaging of the head and neck was performed using the standard protocol during bolus administration of intravenous contrast. Multiplanar CT image reconstructions and MIPs were obtained to evaluate the vascular anatomy. Carotid stenosis measurements (when applicable) are obtained utilizing NASCET criteria, using the distal internal carotid diameter as the denominator. Multiphase CT imaging of the brain was performed following IV bolus contrast injection. Subsequent parametric perfusion maps were calculated using RAPID software. CONTRAST:  49mL OMNIPAQUE IOHEXOL 350 MG/ML SOLN, 3mL OMNIPAQUE IOHEXOL 350 MG/ML SOLN COMPARISON:  Prior head CT from  earlier the same day. FINDINGS: CTA NECK FINDINGS Aortic arch: Visualized aortic arch normal in caliber with normal 3 vessel morphology. Moderate atherosclerotic change about the arch and origin of the great vessels without hemodynamically significant stenosis. Visualized subclavian arteries patent without stenosis. Right carotid system: Right common carotid artery patent from its origin to the bifurcation without stenosis. Mild scattered mixed plaque about the right bifurcation/proximal right ICA without significant stenosis. Right ICA patent distally to the skull base without stenosis, dissection or occlusion. Left carotid system: Left common carotid artery patent from its origin to the bifurcation without significant stenosis. Mixed plaque about the left  bifurcation/proximal left ICA without hemodynamically significant stenosis. Left ICA patent distally to the skull base without stenosis, dissection or occlusion. Vertebral arteries: Both vertebral arteries arise from the subclavian arteries. Focal plaque at the origin of the right vertebral artery with severe at least 80% ostial stenosis (series 8, image 276). Right vertebral artery otherwise widely patent within the neck. 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 Scant distal reconstitution at the left V3 segment, with attenuated flow as it courses into the cranial vault. 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 Skeleton: No acute osseous abnormality. No worrisome osseous lesions. Moderate to advanced multilevel cervical spondylosis, most notable at C5-6 and C6-7. Chronic grade 1 facet mediated anterolisthesis of C4 on C5. Other neck: No other acute soft tissue abnormality within the neck. No appreciable mass lesion or adenopathy. Upper chest: Mild ground-glass opacity with diffuse interlobular septal thickening seen within the visualized lungs, suggesting mild pulmonary interstitial edema. Visualized upper chest demonstrates no other acute finding. Review of the MIP images confirms the above findings CTA HEAD FINDINGS Anterior circulation: Petrous segments widely patent. Mild scattered atheromatous plaque within the cavernous/supraclinoid ICAs without significant stenosis. ICA termini well perfused. A1 segments patent bilaterally. Normal anterior communicating artery. Anterior cerebral arteries widely patent to their distal aspects. No M1 stenosis or occlusion. Normal MCA bifurcations. On the right, there is abrupt occlusion of the distal left M3 branch (series 8, image 78). Right MCA branches otherwise perfused. No large vessel occlusion seen on the left. Posterior circulation: Dominant right vertebral artery patent to the  vertebrobasilar junction without stenosis. Hypoplastic left vertebral artery attenuated but patent as it courses into the cranial vault, and remains patent to the vertebrobasilar junction. Posterior inferior cerebral arteries patent bilaterally. Basilar diminutive but patent to its distal aspect without stenosis. Superior cerebral arteries patent bilaterally. Fetal type origin of both PCAs supplied via a robust posterior communicating arteries. Both PCAs well perfused to their distal aspects without stenosis. Venous sinuses: Patent allowing for timing the contrast bolus. Anatomic variants: Fetal type origin of the PCAs with diminutive vertebrobasilar system. Review of the MIP images confirms the above findings CT Brain Perfusion Findings: ASPECTS: 10 CBF (<30%) Volume: 80mL Perfusion (Tmax>6.0s) volume: 56mL Mismatch Volume: 22mL Infarction Location:Acute core infarct seen involving the posterior right MCA distribution, posterior right region. Finding consistent with the right M3 occlusion. 10 cc surrounding ischemic penumbra. IMPRESSION: CTA HEAD AND NECK IMPRESSION: 1. Positive CTA with acute occlusion of a distal right M3 branch. 2. 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 3. Mild-to-moderate atherosclerotic change elsewhere about the major arterial vasculature of the head and neck. No other hemodynamically significant or correctable  stenosis. 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 22 cc ischemic penumbra, with mismatch volume of 10 cc. Critical Value/emergent results were called by telephone at the time of interpretation on 07/26/2019 at 3:50 am to provider [facility] , who verbally acknowledged these results. Electronically Signed   By: Rise Mu M.D.   On: 07/26/2019 04:42   ECHOCARDIOGRAM COMPLETE  Result Date: 07/26/2019    ECHOCARDIOGRAM REPORT   Patient Name:   [facility] [place] Date of Exam: 07/26/2019 Medical Rec #:  [id number]   Height:       65.0 in Accession #:    [id number]  Weight:       272.0 lb Date of Birth:  1951/01/30  BSA:          2.254 m Patient Age:    68 years    BP:           115/73 mmHg Patient Gender: M           HR:           91 bpm. Exam Location:  Inpatient Procedure: 2D Echo, Cardiac Doppler and Color Doppler Indications:    Stroke 434.91/I163.9  History:        Patient has no prior history of Echocardiogram examinations.                 CAD; Risk Factors:Hypertension and Dyslipidemia.  Sonographer:    Ross Ludwig RDCS (AE) Referring Phys: [id number] TIMOTHY S OPYD  Sonographer Comments: Patient is morbidly obese. IMPRESSIONS  1. Left ventricular ejection fraction, by estimation, is 50 to 55%. The left ventricle has low normal function. The left ventricle has no regional wall motion abnormalities. There is severe concentric left ventricular hypertrophy. Left ventricular diastolic parameters are consistent with Grade II diastolic dysfunction (pseudonormalization).  2. Right ventricular systolic function is normal. The right ventricular size is normal. There is normal pulmonary artery systolic pressure.  3. Right atrial size was moderately dilated.  4. The mitral valve is normal in structure. Mild mitral valve regurgitation. No evidence of mitral stenosis.  5. The aortic valve is tricuspid. Aortic valve regurgitation is not visualized. No aortic stenosis is present.  6. The inferior vena cava is normal in size with greater than 50% respiratory variability, suggesting right atrial pressure of 3 mmHg. FINDINGS  Left Ventricle: Left ventricular ejection fraction, by estimation, is 50 to 55%. The left ventricle has low normal function. The left ventricle has no regional wall motion abnormalities. The left ventricular internal  cavity size was normal in size. There is severe concentric left ventricular hypertrophy. Left ventricular diastolic parameters are consistent with Grade II diastolic dysfunction (pseudonormalization). Right Ventricle: The right ventricular size is normal. No increase in right ventricular wall thickness. Right ventricular systolic function is normal. There is normal pulmonary artery systolic pressure. The tricuspid regurgitant velocity is 2.06 m/s, and  with an assumed right atrial pressure of 3 mmHg, the estimated right ventricular systolic pressure is 20.0 mmHg. Left Atrium: Left atrial size was normal in size. Right Atrium: Right atrial size was moderately dilated. Pericardium: There is no evidence of pericardial effusion. Mitral Valve: The mitral valve is normal in structure. Normal mobility of the mitral valve leaflets. Mild mitral valve regurgitation. No evidence of mitral valve stenosis. MV peak gradient, 4.7 mmHg. The mean mitral valve gradient is 2.0 mmHg. Tricuspid Valve: The tricuspid valve is normal in structure. Tricuspid valve regurgitation is  trivial. No evidence of tricuspid stenosis. Aortic Valve: The aortic valve is tricuspid. Aortic valve regurgitation is not visualized. No aortic stenosis is present. Aortic valve mean gradient measures 3.0 mmHg. Aortic valve peak gradient measures 4.8 mmHg. Aortic valve area, by VTI measures 3.20 cm. Pulmonic Valve: The pulmonic valve was normal in structure. Pulmonic valve regurgitation is not visualized. No evidence of pulmonic stenosis. Aorta: The aortic root is normal in size and structure. Venous: The inferior vena cava is normal in size with greater than 50% respiratory variability, suggesting right atrial pressure of 3 mmHg. IAS/Shunts: No atrial level shunt detected by color flow Doppler.  LEFT VENTRICLE PLAX 2D LVIDd:         3.61 cm      Diastology LVIDs:         2.71 cm      LV e' lateral: 6.00 cm/s LV PW:         1.71 cm      LV e' medial:  3.83 cm/s  LV IVS:        2.11 cm LVOT diam:     2.10 cm LV SV:         63 LV SV Index:   28 LVOT Area:     3.46 cm  LV Volumes (MOD) LV vol d, MOD A2C: 84.8 ml LV vol d, MOD A4C: 121.0 ml LV vol s, MOD A2C: 70.0 ml LV vol s, MOD A4C: 80.2 ml LV SV MOD A2C:     14.8 ml LV SV MOD A4C:     121.0 ml LV SV MOD BP:      26.0 ml RIGHT VENTRICLE            IVC RV Basal diam:  3.08 cm    IVC diam: 1.51 cm RV S prime:     9.75 cm/s TAPSE (M-mode): 1.4 cm LEFT ATRIUM             Index       RIGHT ATRIUM           Index LA diam:        4.00 cm 1.77 cm/m  RA Area:     23.40 cm LA Vol (A2C):   59.0 ml 26.17 ml/m RA Volume:   78.30 ml  34.73 ml/m LA Vol (A4C):   53.6 ml 23.78 ml/m LA Biplane Vol: 61.2 ml 27.15 ml/m  AORTIC VALVE AV Area (Vmax):    3.65 cm AV Area (Vmean):   3.27 cm AV Area (VTI):     3.20 cm AV Vmax:           110.00 cm/s AV Vmean:          81.200 cm/s AV VTI:            0.196 m AV Peak Grad:      4.8 mmHg AV Mean Grad:      3.0 mmHg LVOT Vmax:         116.00 cm/s LVOT Vmean:        76.600 cm/s LVOT VTI:          0.181 m LVOT/AV VTI ratio: 0.92  AORTA Ao Root diam: 3.10 cm MITRAL VALVE             TRICUSPID VALVE MV Area (PHT): 3.28 cm  TR Peak grad:   17.0 mmHg MV Peak grad:  4.7 mmHg  TR Vmax:        206.00 cm/s MV Mean grad:  2.0  mmHg MV Vmax:       1.08 m/s  SHUNTS MV Vmean:      67.5 cm/s Systemic VTI:  0.18 m                          Systemic Diam: 2.10 cm Chilton Si MD Electronically signed by Chilton Si MD Signature Date/Time: 07/26/2019/2:51:39 PM    Final    CT HEAD CODE STROKE WO CONTRAST  Result Date: 07/26/2019 CLINICAL DATA:  Code stroke. Initial evaluation for acute left-sided weakness, numbness, possible stroke. EXAM: CT HEAD WITHOUT CONTRAST TECHNIQUE: Contiguous axial images were obtained from the base of the skull through the vertex without intravenous contrast. COMPARISON:  None available. FINDINGS: Brain: Cerebral volume within normal limits for age. No acute intracranial  hemorrhage. No acute large vessel territory infarct. No mass lesion, mass effect, or midline shift. No hydrocephalus. No extra-axial fluid collection. Vascular: No hyperdense vessel. Scattered vascular calcifications noted within the carotid siphons. Punctate hyperdensity seen on a single slice at the right sylvian fissure favored to reflect a small focal plaque (series 3, image 20). Skull: Scalp soft tissues within normal limits.  Calvarium intact. Sinuses/Orbits: Globes and orbital soft tissues demonstrate no acute finding. Chronic mucoperiosteal thickening noted throughout the paranasal sinuses. Mastoid air cells are clear. Other: None. ASPECTS [facility] Stroke Program Early CT Score) - Ganglionic level infarction (caudate, lentiform nuclei, internal capsule, insula, M1-M3 cortex): 7 - Supraganglionic infarction (M4-M6 cortex): 3 Total score (0-10 with 10 being normal): 10 IMPRESSION: 1. No acute intracranial infarct or other abnormality. 2. ASPECTS is 10. These results were communicated to Dr. Wilford Corner at 3:36 amon 3/11/2021by text page via the [facility] messaging system. Electronically Signed   By: Rise Mu M.D.   On: 07/26/2019 03:39    12-lead ECG SR (personally reviewed) All prior EKG's in EPIC reviewed with no documented atrial fibrillation  Telemetry SR  (personally reviewed)  Assessment and Plan:  1. Cryptogenic stroke The patient presents with cryptogenic stroke.  I spoke at length with the patient about monitoring for afib with an implantable loop recorder.  Risks, benefits, and alteratives to implantable loop recorder were discussed with the patient today.   At this time, the patient is very clear in their decision to proceed with implantable loop recorder.   Wound care was reviewed with the patient (keep incision clean and dry for 3 days).  Wound check scheduled and entered in AVS.  Please call with questions.   Gypsy Balsam, NP 07/27/2019 11:10 AM   I have seen, examined the  patient, and reviewed the above assessment and plan.  Changes to above are made where necessary.  On exam, RRR.  I agree with Dr Roda Shutters that ILR is indicated to evaluate for afib with long term monitoring as a possible cause for stroke. Risks and benefits to ILR were discussed with the patient who wishes to proceed.  Co Sign: Hillis Range, MD 07/27/2019 2:17 PM

## 2019-07-27 NOTE — Evaluation (Signed)
Speech Language Pathology Evaluation Patient Details Name: Philip Gregory MRN: [id number] DOB: 1950/07/20 Today's Date: 07/27/2019 Time: 7353-2992 SLP Time Calculation (min) (ACUTE ONLY): 22 min  Problem List:  Patient Active Problem List   Diagnosis Date Noted  . Acute ischemic right MCA stroke (HCC) 07/26/2019  . CAD (coronary artery disease) 07/26/2019  . Essential hypertension 07/26/2019  . Hyperlipidemia 07/26/2019   Past Medical History:  Past Medical History:  Diagnosis Date  . CAD (coronary artery disease) 07/26/2019  . Essential hypertension 07/26/2019  . Hyperlipidemia 07/26/2019   Past Surgical History: History reviewed. No pertinent surgical history. HPI:  [name], 69 year old man, admitted with left sided weakness, Imaging showed right parietal lobe infarct. PMH of morbid obesity, anemia, DM, HLD, HTN, CAD.    Assessment / Plan / Recommendation Clinical Impression  Linguistic-cognitive evaluation completed due to acute CVA. Patient presents with no cognitive or linguistic deficits. MOCA evaluation was complete and pt scored 26/30 with most points lost on visual spacial task likely due to pt's visual deficits. Patient has a deviated left eye, he reports his vision in that eye is chronically blurry since the 1970's. He held the paper about 2 inches to see the task. Patient able to communicate effectively at the conversational level. He has a slight lisp, reported to be his baseline per pt. No further ST services are needed at this time.     SLP Assessment  SLP Recommendation/Assessment: Patient does not need any further Speech Lanaguage Pathology Services SLP Visit Diagnosis: Cognitive communication deficit (R41.841)    Follow Up Recommendations  None               SLP Evaluation Cognition  Overall Cognitive Status: Within Functional Limits for tasks assessed Arousal/Alertness: Awake/alert Orientation Level: Oriented X4 Attention:  Focused;Sustained;Selective Focused Attention: Appears intact Sustained Attention: Appears intact Selective Attention: Appears intact Memory: Appears intact Immediate Memory Recall: Sock;Blue;Bed Memory Recall Sock: Without Cue Memory Recall Blue: Without Cue Memory Recall Bed: With Cue Awareness: Appears intact Problem Solving: Appears intact Executive Function: Reasoning;Sequencing;Organizing Reasoning: Appears intact Sequencing: Appears intact Organizing: Appears intact Safety/Judgment: Appears intact       Comprehension  Auditory Comprehension Overall Auditory Comprehension: Appears within functional limits for tasks assessed Yes/No Questions: Within Functional Limits Commands: Within Functional Limits Conversation: Complex Visual Recognition/Discrimination Discrimination: Within Function Limits Reading Comprehension Reading Status: Within funtional limits    Expression Expression Primary Mode of Expression: Verbal Verbal Expression Overall Verbal Expression: Appears within functional limits for tasks assessed Initiation: No impairment Level of Generative/Spontaneous Verbalization: Conversation Repetition: No impairment Naming: No impairment Pragmatics: No impairment Written Expression Dominant Hand: Right   Oral / Motor  Oral Motor/Sensory Function Overall Oral Motor/Sensory Function: Mild impairment Facial Symmetry: Abnormal symmetry right Motor Speech Overall Motor Speech: Other (comment)(Pt's speech appears to be more of a lisp than dysarthria.) Respiration: Within functional limits Phonation: Normal Resonance: Within functional limits Articulation: Within functional limitis Intelligibility: Intelligible Motor Planning: Witnin functional limits Motor Speech Errors: Not applicable   GO                    Luis Abed., MA CCC-SLP 07/27/2019, 10:36 AM

## 2019-07-27 NOTE — Evaluation (Signed)
Physical Therapy Evaluation Patient Details Name: Philip Gregory MRN: [id number] DOB: 04/27/1951 Today's Date: 07/27/2019   History of Present Illness  Pt is a 69 year old man admitted 07/26/19 with L sided weakness, + R parietal lobe infarct. PMH: morbid obesity, anemia, DM, HLD, HTN, CAD.  Clinical Impression  Pt in bed upon arrival of PT/OT, agreeable to evaluation at this time. Prior to admission the pt was independent with [facility] for ambulation, but was still driving and performing self-care independently. The pt now presents with limitations in functional mobility, stability, and endurance due to above dx, and will continue to benefit from skilled PT to address these deficits. The pt was able to demo good safety with tranfers and short hallway ambulation, continues to use his SPC from home with no LOB despite poor gait pattern. The pt was most noticeably limited while navigating 5 steps (pt has 5 steps to enter his home) as he requires minA to stabilize his SPC due to poor functional coordination of his LUE. The pt will continue to benefit from skilled PT to maximize functional independence and safety.      Follow Up Recommendations Home health PT;Supervision/Assistance - 24 hour    Equipment Recommendations  None recommended by PT(pt has needed SPC)    Recommendations for Other Services       Precautions / Restrictions Precautions Precautions: None Precaution Comments: L UE with impaired sensation and coordination Restrictions Weight Bearing Restrictions: No      Mobility  Bed Mobility Overal bed mobility: Independent             General bed mobility comments: pt seated at EOB upon arrival  Transfers Overall transfer level: Needs assistance Equipment used: Straight cane Transfers: Sit to/from Stand Sit to Stand: Min guard         General transfer comment: pt uses R hand on cane and pushes from cane rather than bed/chair due to difficulty using  LUE  Ambulation/Gait Ambulation/Gait assistance: Min guard Gait Distance (Feet): 50 Feet Assistive device: Straight cane Gait Pattern/deviations: Step-through pattern;Wide base of support;Decreased stride length   Gait velocity interpretation: <1.31 ft/sec, indicative of household ambulator General Gait Details: pt ambulates with short steps and minimal clearance, uses cane significantly in front of appropriate positioning despite cues, no LOB  Stairs Stairs: Yes Stairs assistance: Min assist Stair Management: One rail Right Number of Stairs: 1(1 step x5) General stair comments: pt completed 1 step x5 with use of 1 rail and SPC in LUE. Pt with poor stability in LUE requiring minA hand over hand to stabilize cane.  Wheelchair Mobility    Modified Rankin (Stroke Patients Only) Modified Rankin (Stroke Patients Only) Pre-Morbid Rankin Score: No symptoms Modified Rankin: Moderately severe disability     Balance Overall balance assessment: Needs assistance Sitting-balance support: No upper extremity supported;Feet supported Sitting balance-Leahy Scale: Good       Standing balance-Leahy Scale: Fair Standing balance comment: can stand without AD                             Pertinent Vitals/Pain Pain Assessment: No/denies pain    Home Living Family/patient expects to be discharged to:: Private residence Living Arrangements: Alone Available Help at Discharge: Family;Friend(s);Available PRN/intermittently Type of Home: Apartment Home Access: Stairs to enter Entrance Stairs-Rails: Right Entrance Stairs-Number of Steps: 5 Home Layout: One level Home Equipment: Shower seat;Cane - single point;Hand held shower head Additional Comments: pt can get a nephew  to stay with him    Prior Function Level of Independence: Independent with assistive device(s)         Comments: ambulates with a cane, "bad" knees, drives     Hand Dominance   Dominant Hand: Right     Extremity/Trunk Assessment   Upper Extremity Assessment Upper Extremity Assessment: Defer to OT evaluation LUE Deficits / Details: shoulder 4/5, 5/5 elbow to hand  LUE Sensation: decreased light touch(negative for drift) LUE Coordination: decreased fine motor;decreased gross motor    Lower Extremity Assessment Lower Extremity Assessment: Overall WFL for tasks assessed(obvious deficits in endurance)    Cervical / Trunk Assessment Cervical / Trunk Assessment: Other exceptions Cervical / Trunk Exceptions: obesity  Communication   Communication: No difficulties  Cognition Arousal/Alertness: Awake/alert Behavior During Therapy: WFL for tasks assessed/performed Overall Cognitive Status: Within Functional Limits for tasks assessed                                 General Comments: aware of deficits in L UE, but not compensating well      General Comments      Exercises     Assessment/Plan    PT Assessment Patient needs continued PT services  PT Problem List Decreased strength;Decreased mobility;Decreased coordination;Obesity;Decreased knowledge of precautions;Decreased activity tolerance;Cardiopulmonary status limiting activity;Decreased balance;Decreased knowledge of use of DME;Impaired sensation       PT Treatment Interventions DME instruction;Therapeutic exercise;Balance training;Gait training;Stair training;Functional mobility training;Therapeutic activities;Patient/family education;Cognitive remediation;Neuromuscular re-education    PT Goals (Current goals can be found in the Care Plan section)  Acute Rehab PT Goals Patient Stated Goal: to regain use of L UE PT Goal Formulation: With patient Time For Goal Achievement: 08/10/19 Potential to Achieve Goals: Good    Frequency Min 3X/week   Barriers to discharge        Co-evaluation               AM-PAC PT "6 Clicks" Mobility  Outcome Measure Help needed turning from your back to your side while  in a flat bed without using bedrails?: None Help needed moving from lying on your back to sitting on the side of a flat bed without using bedrails?: None Help needed moving to and from a bed to a chair (including a wheelchair)?: A Little Help needed standing up from a chair using your arms (e.g., wheelchair or bedside chair)?: A Little Help needed to walk in hospital room?: A Little Help needed climbing 3-5 steps with a railing? : A Little 6 Click Score: 20    End of Session Equipment Utilized During Treatment: Gait belt Activity Tolerance: Patient tolerated treatment well Patient left: in chair;with call bell/phone within reach Nurse Communication: Mobility status PT Visit Diagnosis: Difficulty in walking, not elsewhere classified (R26.2);Hemiplegia and hemiparesis;Muscle weakness (generalized) (M62.81) Hemiplegia - Right/Left: Left Hemiplegia - dominant/non-dominant: Non-dominant Hemiplegia - caused by: Cerebral infarction    Time: 2094-7096 PT Time Calculation (min) (ACUTE ONLY): 15 min   Charges:   PT Evaluation $PT Eval Low Complexity: 1 Low          Rolm Baptise, PT, DPT   Acute Rehabilitation Department Pager #: [phone]  Gaetana Michaelis 07/27/2019, 11:52 AM

## 2019-07-27 NOTE — Progress Notes (Signed)
Pt back to room from cath lab after loop recorder placed. Incision site dsg remains clean, dry and intact with no stain or active bleeding noted. Pt sitting up in chair and VSS. Call light within reach and will continue to closely monitor pt. Dionne Bucy RN

## 2019-07-27 NOTE — H&P (View-Only) (Signed)
° ° °ELECTROPHYSIOLOGY CONSULT NOTE  °Patient ID: Philip Gregory °MRN: [id number], DOB/AGE: 07/05/1950  ° °Admit date: 07/26/2019 °Date of Consult: 07/27/2019 ° °Primary Physician: System, Pcp Not In °Primary Cardiologist: [place] °Reason for Consultation: Cryptogenic stroke; recommendations regarding Implantable Loop Recorder ° °History of Present Illness °EP has been asked to evaluate Philip Gregory for placement of an implantable loop recorder to monitor for atrial fibrillation by Dr Xu.  The patient was admitted on 07/26/2019 with left sided weakness and hemisensory deficit.  Imaging demonstrated R parietal lobe infarct felt to be embolic 2/2 unknown source.  he has undergone workup for stroke including echocardiogram and carotid imaging.  The patient has been monitored on telemetry which has demonstrated sinus rhythm with no arrhythmias.   ° °Echocardiogram this admission demonstrated EF 50-55%, LA 40.  Lab work is reviewed. ° °Prior to admission, the patient denies chest pain, shortness of breath, dizziness, palpitations, or syncope.  They are recovering from their stroke with plans to return home at discharge. ° ° °Past Medical History:  °Diagnosis Date  °• CAD (coronary artery disease) 07/26/2019  °• Essential hypertension 07/26/2019  °• Hyperlipidemia 07/26/2019  °  ° °Surgical History: History reviewed. No pertinent surgical history.  ° °Medications Prior to Admission  °Medication Sig Dispense Refill Last Dose  °• aspirin EC 81 MG tablet Take 81 mg by mouth daily.   07/25/2019 at Unknown time  °• hydrOXYzine (ATARAX/VISTARIL) 25 MG tablet Take 25 mg by mouth 3 (three) times daily as needed for anxiety.   07/25/2019 at Unknown time  °• lisinopril-hydrochlorothiazide (ZESTORETIC) 20-12.5 MG tablet Take 1 tablet by mouth daily.   07/25/2019 at Unknown time  °• metoprolol succinate (TOPROL-XL) 50 MG 24 hr tablet Take 50 mg by mouth daily. Take with or immediately following a meal.   07/25/2019 at 0900  °• omeprazole  (PRILOSEC) 20 MG capsule Take 20 mg by mouth 2 (two) times daily before a meal.   07/25/2019 at Unknown time  °• rosuvastatin (CRESTOR) 20 MG tablet Take 20 mg by mouth daily.   07/25/2019 at Unknown time  ° ° °Inpatient Medications:  °• aspirin  300 mg Rectal Daily  ° Or  °• aspirin  325 mg Oral Daily  °• atorvastatin  80 mg Oral q1800  °• clopidogrel  75 mg Oral Daily  °• enoxaparin (LOVENOX) injection  40 mg Subcutaneous Daily  °• midodrine  5 mg Oral TID WC  °• sodium chloride flush  3 mL Intravenous Once  ° ° °Allergies: No Known Allergies ° °Social History  ° °Socioeconomic History  °• Marital status: Single  °  Spouse name: Not on file  °• Number of children: Not on file  °• Years of education: Not on file  °• Highest education level: Not on file  °Occupational History  °• Not on file  °Tobacco Use  °• Smoking status: Former Smoker  °  Quit date: 1995  °  Years since quitting: 26.2  °• Smokeless tobacco: Never Used  °Substance and Sexual Activity  °• Alcohol use: Yes  °  Alcohol/week: 2.0 standard drinks  °  Types: 2 Cans of beer per week  °• Drug use: Not on file  °• Sexual activity: Not on file  °Other Topics Concern  °• Not on file  °Social History Narrative  °• Not on file  ° °Social Determinants of Health  ° °Financial Resource Strain:   °• Difficulty of Paying Living Expenses:   °Food Insecurity:   °•   Worried About Running Out of Food in the Last Year:   °• Ran Out of Food in the Last Year:   °Transportation Needs:   °• Lack of Transportation (Medical):   °• Lack of Transportation (Non-Medical):   °Physical Activity:   °• Days of Exercise per Week:   °• Minutes of Exercise per Session:   °Stress:   °• Feeling of Stress :   °Social Connections:   °• Frequency of Communication with Friends and Family:   °• Frequency of Social Gatherings with Friends and Family:   °• Attends Religious Services:   °• Active Member of Clubs or Organizations:   °• Attends Club or Organization Meetings:   °• Marital Status:     °Intimate Partner Violence:   °• Fear of Current or Ex-Partner:   °• Emotionally Abused:   °• Physically Abused:   °• Sexually Abused:   °  ° °Family History  °Problem Relation Age of Onset  °• Hypertension Mother   °• Diabetes Mother   °• Cancer Father   °• Hypertension Sister   °  ° ° °Review of Systems: °All other systems reviewed and are otherwise negative except as noted above. ° °Physical Exam: °Vitals:  ° 07/26/19 1936 07/26/19 2359 07/27/19 0431 07/27/19 0833  °BP: 121/76 106/74 106/73 129/84  °Pulse: 86 88 88 87  °Resp: 18 18 18 20  °Temp: 98 °F (36.7 °C) 98 °F (36.7 °C) 97.9 °F (36.6 °C) 98.1 °F (36.7 °C)  °TempSrc: Oral Oral Oral Oral  °SpO2: 100% 100% 99% 99%  °Weight:      °Height:      ° ° °GEN- The patient is well appearing, alert and oriented x 3 today.   °Head- normocephalic, atraumatic °Eyes-  Sclera clear, conjunctiva pink °Ears- hearing intact °Oropharynx- clear °Neck- supple °Lungs- Clear to ausculation bilaterally, normal work of breathing °Heart- Regular rate and rhythm, no murmurs, rubs or gallops  °GI- soft, NT, ND, + BS °Extremities- no clubbing, cyanosis, or edema °MS- no significant deformity or atrophy °Skin- no rash or lesion °Psych- euthymic mood, full affect ° ° °Labs: °  °Lab Results  °Component Value Date  ° WBC 10.2 07/26/2019  ° HGB 15.0 07/26/2019  ° HCT 44.0 07/26/2019  ° MCV 90.1 07/26/2019  ° PLT 288 07/26/2019  °  °Recent Labs  °Lab 07/26/19 °0318 07/26/19 °0318 07/26/19 °0328  °NA 139   < > 140  °K 3.9   < > 4.0  °CL 105   < > 103  °CO2 24  --   --   °BUN 16   < > 19  °CREATININE 1.27*   < > 1.20  °CALCIUM 9.0  --   --   °PROT 6.8  --   --   °BILITOT 0.6  --   --   °ALKPHOS 98  --   --   °ALT 18  --   --   °AST 20  --   --   °GLUCOSE 98   < > 92  ° < > = values in this interval not displayed.  ° °  °Radiology/Studies: CT Code Stroke CTA Head W/WO contrast ° °Result Date: 07/26/2019 °CLINICAL DATA:  Initial evaluation for acute left-sided weakness. EXAM: CT ANGIOGRAPHY HEAD  AND NECK CT PERFUSION BRAIN TECHNIQUE: Multidetector CT imaging of the head and neck was performed using the standard protocol during bolus administration of intravenous contrast. Multiplanar CT image reconstructions and MIPs were obtained to evaluate the vascular anatomy.   Carotid stenosis measurements (when applicable) are obtained utilizing NASCET criteria, using the distal internal carotid diameter as the denominator. Multiphase CT imaging of the brain was performed following IV bolus contrast injection. Subsequent parametric perfusion maps were calculated using RAPID software. CONTRAST:  50mL OMNIPAQUE IOHEXOL 350 MG/ML SOLN, 50mL OMNIPAQUE IOHEXOL 350 MG/ML SOLN COMPARISON:  Prior head CT from earlier the same day. FINDINGS: CTA NECK FINDINGS Aortic arch: Visualized aortic arch normal in caliber with normal 3 vessel morphology. Moderate atherosclerotic change about the arch and origin of the great vessels without hemodynamically significant stenosis. Visualized subclavian arteries patent without stenosis. Right carotid system: Right common carotid artery patent from its origin to the bifurcation without stenosis. Mild scattered mixed plaque about the right bifurcation/proximal right ICA without significant stenosis. Right ICA patent distally to the skull base without stenosis, dissection or occlusion. Left carotid system: Left common carotid artery patent from its origin to the bifurcation without significant stenosis. Mixed plaque about the left bifurcation/proximal left ICA without hemodynamically significant stenosis. Left ICA patent distally to the skull base without stenosis, dissection or occlusion. Vertebral arteries: Both vertebral arteries arise from the subclavian arteries. Focal plaque at the origin of the right vertebral artery with severe at least 80% ostial stenosis (series 8, image 276). Right vertebral artery otherwise widely patent within the neck. 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 Scant distal reconstitution at the left V3 segment, with attenuated flow as it courses into the cranial vault. 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 Skeleton: No acute osseous abnormality. No worrisome osseous lesions. Moderate to advanced multilevel cervical spondylosis, most notable at C5-6 and C6-7. Chronic grade 1 facet mediated anterolisthesis of C4 on C5. Other neck: No other acute soft tissue abnormality within the neck. No appreciable mass lesion or adenopathy. Upper chest: Mild ground-glass opacity with diffuse interlobular septal thickening seen within the visualized lungs, suggesting mild pulmonary interstitial edema. Visualized upper chest demonstrates no other acute finding. Review of the MIP images confirms the above findings CTA HEAD FINDINGS Anterior circulation: Petrous segments widely patent. Mild scattered atheromatous plaque within the cavernous/supraclinoid ICAs without significant stenosis. ICA termini well perfused. A1 segments patent bilaterally. Normal anterior communicating artery. Anterior cerebral arteries widely patent to their distal aspects. No M1 stenosis or occlusion. Normal MCA bifurcations. On the right, there is abrupt occlusion of the distal left M3 branch (series 8, image 78). Right MCA branches otherwise perfused. No large vessel occlusion seen on the left. Posterior circulation: Dominant right vertebral artery patent to the vertebrobasilar junction without stenosis. Hypoplastic left vertebral artery attenuated but patent as it courses into the cranial vault, and remains patent to the vertebrobasilar junction. Posterior inferior cerebral arteries patent bilaterally. Basilar diminutive but patent to its distal aspect without stenosis. Superior cerebral arteries patent bilaterally. Fetal type origin of both PCAs supplied via a robust posterior communicating arteries. Both PCAs well perfused  to their distal aspects without stenosis. Venous sinuses: Patent allowing for timing the contrast bolus. Anatomic variants: Fetal type origin of the PCAs with diminutive vertebrobasilar system. Review of the MIP images confirms the above findings CT Brain Perfusion Findings: ASPECTS: 10 CBF (<30%) Volume: 12mL Perfusion (Tmax>6.0s) volume: 22mL Mismatch Volume: 10mL Infarction Location:Acute core infarct seen involving the posterior right MCA distribution, posterior right region. Finding consistent with the right M3 occlusion. 10 cc surrounding ischemic penumbra. IMPRESSION: CTA HEAD AND NECK IMPRESSION: 1. Positive CTA with acute occlusion   of a distal right M3 branch. 2. 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 3. Mild-to-moderate atherosclerotic change elsewhere about the major arterial vasculature of the head and neck. No other hemodynamically significant or correctable stenosis. 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 22 cc ischemic penumbra, with mismatch volume of 10 cc. Critical Value/emergent results were called by telephone at the time of interpretation on 07/26/2019 at 3:50 am to provider ASHISH ARORA , who verbally acknowledged these results. Electronically Signed   By: Benjamin  McClintock M.D.   On: 07/26/2019 04:42  ° °CT Code Stroke CTA Neck W/WO contrast ° °Result Date: 07/26/2019 °CLINICAL DATA:  Initial evaluation for acute left-sided weakness. EXAM: CT ANGIOGRAPHY HEAD AND NECK CT PERFUSION BRAIN TECHNIQUE: Multidetector CT imaging of the head and neck was performed using the standard protocol during bolus administration of intravenous contrast. Multiplanar CT image reconstructions and MIPs were obtained to evaluate the vascular anatomy.  Carotid stenosis measurements (when applicable) are obtained utilizing NASCET criteria, using the distal internal carotid diameter as the denominator. Multiphase CT imaging of the brain was performed following IV bolus contrast injection. Subsequent parametric perfusion maps were calculated using RAPID software. CONTRAST:  50mL OMNIPAQUE IOHEXOL 350 MG/ML SOLN, 50mL OMNIPAQUE IOHEXOL 350 MG/ML SOLN COMPARISON:  Prior head CT from earlier the same day. FINDINGS: CTA NECK FINDINGS Aortic arch: Visualized aortic arch normal in caliber with normal 3 vessel morphology. Moderate atherosclerotic change about the arch and origin of the great vessels without hemodynamically significant stenosis. Visualized subclavian arteries patent without stenosis. Right carotid system: Right common carotid artery patent from its origin to the bifurcation without stenosis. Mild scattered mixed plaque about the right bifurcation/proximal right ICA without significant stenosis. Right ICA patent distally to the skull base without stenosis, dissection or occlusion. Left carotid system: Left common carotid artery patent from its origin to the bifurcation without significant stenosis. Mixed plaque about the left bifurcation/proximal left ICA without hemodynamically significant stenosis. Left ICA patent distally to the skull base without stenosis, dissection or occlusion. Vertebral arteries: Both vertebral arteries arise from the subclavian arteries. Focal plaque at the origin of the right vertebral artery with severe at least 80% ostial stenosis (series 8, image 276). Right vertebral artery otherwise widely patent within the neck. 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 Scant distal reconstitution at the left V3 segment, with attenuated flow as it courses into the cranial vault. 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 Skeleton: No acute osseous  abnormality. No worrisome osseous lesions. Moderate to advanced multilevel cervical spondylosis, most notable at C5-6 and C6-7. Chronic grade 1 facet mediated anterolisthesis of C4 on C5. Other neck: No other acute soft tissue abnormality within the neck. No appreciable mass lesion or adenopathy. Upper chest: Mild ground-glass opacity with diffuse interlobular septal thickening seen within the visualized lungs, suggesting mild pulmonary interstitial edema. Visualized upper chest demonstrates no other acute finding. Review of the MIP images confirms the above findings CTA HEAD FINDINGS Anterior circulation: Petrous segments widely patent. Mild scattered atheromatous plaque within the cavernous/supraclinoid ICAs without significant stenosis. ICA termini well perfused. A1 segments patent bilaterally. Normal anterior communicating artery. Anterior cerebral arteries widely patent to their distal aspects. No M1 stenosis or occlusion. Normal   MCA bifurcations. On the right, there is abrupt occlusion of the distal left M3 branch (series 8, image 78). Right MCA branches otherwise perfused. No large vessel occlusion seen on the left. Posterior circulation: Dominant right vertebral artery patent to the vertebrobasilar junction without stenosis. Hypoplastic left vertebral artery attenuated but patent as it courses into the cranial vault, and remains patent to the vertebrobasilar junction. Posterior inferior cerebral arteries patent bilaterally. Basilar diminutive but patent to its distal aspect without stenosis. Superior cerebral arteries patent bilaterally. Fetal type origin of both PCAs supplied via a robust posterior communicating arteries. Both PCAs well perfused to their distal aspects without stenosis. Venous sinuses: Patent allowing for timing the contrast bolus. Anatomic variants: Fetal type origin of the PCAs with diminutive vertebrobasilar system. Review of the MIP images confirms the above findings CT Brain Perfusion  Findings: ASPECTS: 10 CBF (<30%) Volume: 12mL Perfusion (Tmax>6.0s) volume: 22mL Mismatch Volume: 10mL Infarction Location:Acute core infarct seen involving the posterior right MCA distribution, posterior right region. Finding consistent with the right M3 occlusion. 10 cc surrounding ischemic penumbra. IMPRESSION: CTA HEAD AND NECK IMPRESSION: 1. Positive CTA with acute occlusion of a distal right M3 branch. 2. 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 3. Mild-to-moderate atherosclerotic change elsewhere about the major arterial vasculature of the head and neck. No other hemodynamically significant or correctable stenosis. 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 22 cc ischemic penumbra, with mismatch volume of 10 cc. Critical Value/emergent results were called by telephone at the time of interpretation on 07/26/2019 at 3:50 am to provider ASHISH ARORA , who verbally acknowledged these results. Electronically Signed   By: Benjamin  McClintock M.D.   On: 07/26/2019 04:42  ° °MR BRAIN WO CONTRAST ° °Result Date: 07/26/2019 °CLINICAL DATA:  Stroke follow-up. EXAM: MRI HEAD WITHOUT CONTRAST TECHNIQUE: Multiplanar, multiecho pulse sequences of the brain and surrounding structures were obtained without intravenous contrast. COMPARISON:  CTA head and CT perfusion earlier today FINDINGS: Brain: Acute infarct right parietal lobe. This appears to involve primarily the sensory cortex but also some of the motor cortex. No associated hemorrhage Ventricle size normal. Negative for mass lesion. No significant chronic ischemia. Vascular: Normal arterial flow voids. Skull and upper cervical spine: Negative Sinuses/Orbits: Mild mucosal edema paranasal sinuses. Disconjugate gaze. No  orbital mass. Other: None IMPRESSION: Acute infarct right parietal lobe which appears to predominantly involve the sensory cortex with a smaller amount of motor cortex involvement. No associated hemorrhage. No chronic ischemia. Electronically Signed   By: Charles  Clark M.D.   On: 07/26/2019 10:01  ° °CT Code Stroke Cerebral Perfusion with contrast ° °Result Date: 07/26/2019 °CLINICAL DATA:  Initial evaluation for acute left-sided weakness. EXAM: CT ANGIOGRAPHY HEAD AND NECK CT PERFUSION BRAIN TECHNIQUE: Multidetector CT imaging of the head and neck was performed using the standard protocol during bolus administration of intravenous contrast. Multiplanar CT image reconstructions and MIPs were obtained to evaluate the vascular anatomy. Carotid stenosis measurements (when applicable) are obtained utilizing NASCET criteria, using the distal internal carotid diameter as the denominator. Multiphase CT imaging of the brain was performed following IV bolus contrast injection. Subsequent parametric perfusion maps were calculated using RAPID software. CONTRAST:  50mL OMNIPAQUE IOHEXOL 350 MG/ML SOLN, 50mL OMNIPAQUE IOHEXOL 350 MG/ML SOLN COMPARISON:  Prior head CT from   earlier the same day. FINDINGS: CTA NECK FINDINGS Aortic arch: Visualized aortic arch normal in caliber with normal 3 vessel morphology. Moderate atherosclerotic change about the arch and origin of the great vessels without hemodynamically significant stenosis. Visualized subclavian arteries patent without stenosis. Right carotid system: Right common carotid artery patent from its origin to the bifurcation without stenosis. Mild scattered mixed plaque about the right bifurcation/proximal right ICA without significant stenosis. Right ICA patent distally to the skull base without stenosis, dissection or occlusion. Left carotid system: Left common carotid artery patent from its origin to the bifurcation without significant stenosis. Mixed plaque about the left  bifurcation/proximal left ICA without hemodynamically significant stenosis. Left ICA patent distally to the skull base without stenosis, dissection or occlusion. Vertebral arteries: Both vertebral arteries arise from the subclavian arteries. Focal plaque at the origin of the right vertebral artery with severe at least 80% ostial stenosis (series 8, image 276). Right vertebral artery otherwise widely patent within the neck. 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 Scant distal reconstitution at the left V3 segment, with attenuated flow as it courses into the cranial vault. 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 Skeleton: No acute osseous abnormality. No worrisome osseous lesions. Moderate to advanced multilevel cervical spondylosis, most notable at C5-6 and C6-7. Chronic grade 1 facet mediated anterolisthesis of C4 on C5. Other neck: No other acute soft tissue abnormality within the neck. No appreciable mass lesion or adenopathy. Upper chest: Mild ground-glass opacity with diffuse interlobular septal thickening seen within the visualized lungs, suggesting mild pulmonary interstitial edema. Visualized upper chest demonstrates no other acute finding. Review of the MIP images confirms the above findings CTA HEAD FINDINGS Anterior circulation: Petrous segments widely patent. Mild scattered atheromatous plaque within the cavernous/supraclinoid ICAs without significant stenosis. ICA termini well perfused. A1 segments patent bilaterally. Normal anterior communicating artery. Anterior cerebral arteries widely patent to their distal aspects. No M1 stenosis or occlusion. Normal MCA bifurcations. On the right, there is abrupt occlusion of the distal left M3 branch (series 8, image 78). Right MCA branches otherwise perfused. No large vessel occlusion seen on the left. Posterior circulation: Dominant right vertebral artery patent to the  vertebrobasilar junction without stenosis. Hypoplastic left vertebral artery attenuated but patent as it courses into the cranial vault, and remains patent to the vertebrobasilar junction. Posterior inferior cerebral arteries patent bilaterally. Basilar diminutive but patent to its distal aspect without stenosis. Superior cerebral arteries patent bilaterally. Fetal type origin of both PCAs supplied via a robust posterior communicating arteries. Both PCAs well perfused to their distal aspects without stenosis. Venous sinuses: Patent allowing for timing the contrast bolus. Anatomic variants: Fetal type origin of the PCAs with diminutive vertebrobasilar system. Review of the MIP images confirms the above findings CT Brain Perfusion Findings: ASPECTS: 10 CBF (<30%) Volume: 12mL Perfusion (Tmax>6.0s) volume: 22mL Mismatch Volume: 10mL Infarction Location:Acute core infarct seen involving the posterior right MCA distribution, posterior right region. Finding consistent with the right M3 occlusion. 10 cc surrounding ischemic penumbra. IMPRESSION: CTA HEAD AND NECK IMPRESSION: 1. Positive CTA with acute occlusion of a distal right M3 branch. 2. 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 3. Mild-to-moderate atherosclerotic change elsewhere about the major arterial vasculature of the head and neck. No other hemodynamically significant or correctable   stenosis. 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 22 cc ischemic penumbra, with mismatch volume of 10 cc. Critical Value/emergent results were called by telephone at the time of interpretation on 07/26/2019 at 3:50 am to provider ASHISH ARORA , who verbally acknowledged these results. Electronically Signed   By: Benjamin   McClintock M.D.   On: 07/26/2019 04:42  ° °ECHOCARDIOGRAM COMPLETE ° °Result Date: 07/26/2019 °   ECHOCARDIOGRAM REPORT   Patient Name:   Demarrion Vajda Date of Exam: 07/26/2019 Medical Rec #:  [id number]   Height:       65.0 in Accession #:    [id number]  Weight:       272.0 lb Date of Birth:  01/07/1951  BSA:          2.254 m² Patient Age:    68 years    BP:           115/73 mmHg Patient Gender: M           HR:           91 bpm. Exam Location:  Inpatient Procedure: 2D Echo, Cardiac Doppler and Color Doppler Indications:    Stroke 434.91/I163.9  History:        Patient has no prior history of Echocardiogram examinations.                 CAD; Risk Factors:Hypertension and Dyslipidemia.  Sonographer:    Arthur Guy RDCS (AE) Referring Phys: [id number] TIMOTHY S OPYD  Sonographer Comments: Patient is morbidly obese. IMPRESSIONS  1. Left ventricular ejection fraction, by estimation, is 50 to 55%. The left ventricle has low normal function. The left ventricle has no regional wall motion abnormalities. There is severe concentric left ventricular hypertrophy. Left ventricular diastolic parameters are consistent with Grade II diastolic dysfunction (pseudonormalization).  2. Right ventricular systolic function is normal. The right ventricular size is normal. There is normal pulmonary artery systolic pressure.  3. Right atrial size was moderately dilated.  4. The mitral valve is normal in structure. Mild mitral valve regurgitation. No evidence of mitral stenosis.  5. The aortic valve is tricuspid. Aortic valve regurgitation is not visualized. No aortic stenosis is present.  6. The inferior vena cava is normal in size with greater than 50% respiratory variability, suggesting right atrial pressure of 3 mmHg. FINDINGS  Left Ventricle: Left ventricular ejection fraction, by estimation, is 50 to 55%. The left ventricle has low normal function. The left ventricle has no regional wall motion abnormalities. The left ventricular internal  cavity size was normal in size. There is severe concentric left ventricular hypertrophy. Left ventricular diastolic parameters are consistent with Grade II diastolic dysfunction (pseudonormalization). Right Ventricle: The right ventricular size is normal. No increase in right ventricular wall thickness. Right ventricular systolic function is normal. There is normal pulmonary artery systolic pressure. The tricuspid regurgitant velocity is 2.06 m/s, and  with an assumed right atrial pressure of 3 mmHg, the estimated right ventricular systolic pressure is 20.0 mmHg. Left Atrium: Left atrial size was normal in size. Right Atrium: Right atrial size was moderately dilated. Pericardium: There is no evidence of pericardial effusion. Mitral Valve: The mitral valve is normal in structure. Normal mobility of the mitral valve leaflets. Mild mitral valve regurgitation. No evidence of mitral valve stenosis. MV peak gradient, 4.7 mmHg. The mean mitral valve gradient is 2.0 mmHg. Tricuspid Valve: The tricuspid valve is normal in structure. Tricuspid valve regurgitation is   trivial. No evidence of tricuspid stenosis. Aortic Valve: The aortic valve is tricuspid. Aortic valve regurgitation is not visualized. No aortic stenosis is present. Aortic valve mean gradient measures 3.0 mmHg. Aortic valve peak gradient measures 4.8 mmHg. Aortic valve area, by VTI measures 3.20 cm². Pulmonic Valve: The pulmonic valve was normal in structure. Pulmonic valve regurgitation is not visualized. No evidence of pulmonic stenosis. Aorta: The aortic root is normal in size and structure. Venous: The inferior vena cava is normal in size with greater than 50% respiratory variability, suggesting right atrial pressure of 3 mmHg. IAS/Shunts: No atrial level shunt detected by color flow Doppler.  LEFT VENTRICLE PLAX 2D LVIDd:         3.61 cm      Diastology LVIDs:         2.71 cm      LV e' lateral: 6.00 cm/s LV PW:         1.71 cm      LV e' medial:  3.83 cm/s  LV IVS:        2.11 cm LVOT diam:     2.10 cm LV SV:         63 LV SV Index:   28 LVOT Area:     3.46 cm²  LV Volumes (MOD) LV vol d, MOD A2C: 84.8 ml LV vol d, MOD A4C: 121.0 ml LV vol s, MOD A2C: 70.0 ml LV vol s, MOD A4C: 80.2 ml LV SV MOD A2C:     14.8 ml LV SV MOD A4C:     121.0 ml LV SV MOD BP:      26.0 ml RIGHT VENTRICLE            IVC RV Basal diam:  3.08 cm    IVC diam: 1.51 cm RV S prime:     9.75 cm/s TAPSE (M-mode): 1.4 cm LEFT ATRIUM             Index       RIGHT ATRIUM           Index LA diam:        4.00 cm 1.77 cm/m²  RA Area:     23.40 cm² LA Vol (A2C):   59.0 ml 26.17 ml/m² RA Volume:   78.30 ml  34.73 ml/m² LA Vol (A4C):   53.6 ml 23.78 ml/m² LA Biplane Vol: 61.2 ml 27.15 ml/m²  AORTIC VALVE AV Area (Vmax):    3.65 cm² AV Area (Vmean):   3.27 cm² AV Area (VTI):     3.20 cm² AV Vmax:           110.00 cm/s AV Vmean:          81.200 cm/s AV VTI:            0.196 m AV Peak Grad:      4.8 mmHg AV Mean Grad:      3.0 mmHg LVOT Vmax:         116.00 cm/s LVOT Vmean:        76.600 cm/s LVOT VTI:          0.181 m LVOT/AV VTI ratio: 0.92  AORTA Ao Root diam: 3.10 cm MITRAL VALVE             TRICUSPID VALVE MV Area (PHT): 3.28 cm²  TR Peak grad:   17.0 mmHg MV Peak grad:  4.7 mmHg  TR Vmax:        206.00 cm/s MV Mean grad:  2.0   mmHg MV Vmax:       1.08 m/s  SHUNTS MV Vmean:      67.5 cm/s Systemic VTI:  0.18 m                          Systemic Diam: 2.10 cm Tiffany [place] MD Electronically signed by Tiffany  MD Signature Date/Time: 07/26/2019/2:51:39 PM    Final   ° °CT HEAD CODE STROKE WO CONTRAST ° °Result Date: 07/26/2019 °CLINICAL DATA:  Code stroke. Initial evaluation for acute left-sided weakness, numbness, possible stroke. EXAM: CT HEAD WITHOUT CONTRAST TECHNIQUE: Contiguous axial images were obtained from the base of the skull through the vertex without intravenous contrast. COMPARISON:  None available. FINDINGS: Brain: Cerebral volume within normal limits for age. No acute intracranial  hemorrhage. No acute large vessel territory infarct. No mass lesion, mass effect, or midline shift. No hydrocephalus. No extra-axial fluid collection. Vascular: No hyperdense vessel. Scattered vascular calcifications noted within the carotid siphons. Punctate hyperdensity seen on a single slice at the right sylvian fissure favored to reflect a small focal plaque (series 3, image 20). Skull: Scalp soft tissues within normal limits.  Calvarium intact. Sinuses/Orbits: Globes and orbital soft tissues demonstrate no acute finding. Chronic mucoperiosteal thickening noted throughout the paranasal sinuses. Mastoid air cells are clear. Other: None. ASPECTS ([place] Stroke Program Early CT Score) - Ganglionic level infarction (caudate, lentiform nuclei, internal capsule, insula, M1-M3 cortex): 7 - Supraganglionic infarction (M4-M6 cortex): 3 Total score (0-10 with 10 being normal): 10 IMPRESSION: 1. No acute intracranial infarct or other abnormality. 2. ASPECTS is 10. These results were communicated to Dr. Arora at 3:36 amon 3/11/2021by text page via the [employer] messaging system. Electronically Signed   By: Benjamin  McClintock M.D.   On: 07/26/2019 03:39  ° ° °12-lead ECG SR (personally reviewed) °All prior EKG's in EPIC reviewed with no documented atrial fibrillation ° °Telemetry SR  (personally reviewed) ° °Assessment and Plan: ° °1. Cryptogenic stroke °The patient presents with cryptogenic stroke.  I spoke at length with the patient about monitoring for afib with an implantable loop recorder.  Risks, benefits, and alteratives to implantable loop recorder were discussed with the patient today.   At this time, the patient is very clear in their decision to proceed with implantable loop recorder.  ° °Wound care was reviewed with the patient (keep incision clean and dry for 3 days).  Wound check scheduled and entered in AVS. ° °Please call with questions. ° ° °Amber Seiler, NP °07/27/2019 °11:10 AM ° ° °I have seen, examined the  patient, and reviewed the above assessment and plan.  Changes to above are made where necessary.  On exam, RRR.  I agree with Dr Xu that ILR is indicated to evaluate for afib with long term monitoring as a possible cause for stroke. °Risks and benefits to ILR were discussed with the patient who wishes to proceed. ° °Co Sign: Kerianne Gurr, MD °07/27/2019 °2:17 PM ° ° °

## 2019-07-27 NOTE — Discharge Summary (Signed)
Physician Discharge Summary  Maclin Guerrette PJK:[id number] DOB: [birth year] DOA: 07/26/2019  PCP: System, Pcp Not In  Admit date: 07/26/2019 Discharge date: 07/27/2019  Admitted From: Home Discharge disposition: Home with 24-hour support from nephew who lives with him   Recommendations for Outpatient Follow-Up:   1. Aspirin plus Plavix together for 3 months and Plavix alone 2. Loop recorder placed to rule out atrial fibrillation 3. Patient started on midodrine for blood pressure support so will need close outpatient follow-up-avoid low blood pressure 4. Marijuana cessation   Discharge Diagnosis:   Principal Problem:   Acute ischemic right MCA stroke [facility]) Active Problems:   CAD (coronary artery disease)   Essential hypertension   Hyperlipidemia    Discharge Condition: Improved.  Diet recommendation:Regular.  Wound care: None.  Code status: Full.   History of Present Illness:   Philip Gregory is a 69 y.o. male with medical history significant for nonobstructive coronary artery disease, hypertension, hyperlipidemia, diet-controlled diabetes mellitus, who presented to the emergency department with acute onset of left-sided numbness and weakness.  Patient reports that he was in his usual state when he went to bed last night at approximately 8:30 PM but woke this morning at around 2 AM to go to the bathroom but was unable to get out of bed due to numbness and weakness involving the left arm and leg.  He also reports numbness in the left face.  He denies any recent chest pain or palpitations, denies headache, fall, trauma, and has not had any fevers or chills.  He has never had similar symptoms  ED Course: Upon arrival to the ED, patient is found to be afebrile, saturating well on room air, and with normal heart rate and blood pressure.  EKG features sinus rhythm with first-degree AV nodal block, IVCD, LVH, and nonspecific ST and ST abnormality.  Noncontrast head CT was negative  for acute intracranial bleed.  Chemistry panel notable for creatinine 1.27, similar to prior.  CBC was unremarkable.  CTA head and neck with acute occlusion of distal right M3 branch, and perfusion study notable for 12 cc acute cord infarct involving the posterior right frontal region with 22 since ischemic penumbra.  Neurology evaluated the patient upon his arrival to the ED and hospitalist were consulted for admission.   Hospital Course by Problem:   Right parietal lobe infarct embolic secondary to unclear source MRI showing right parietal lobe infarct Echo with an EF of 50 to 55%, no source seen Duplex negative for DVT LDL: 70 Hemoglobin A1c: 6 Loop recorder placed on 3/12 to rule out atrial fibrillation Plan for aspirin 325 mg daily plus Plavix for 3 months then Plavix alone Home health ordered and nephew to provide supervision   Hypertension Avoid hypotensive Long-term blood pressure goal normotensive Have added midodrine 5 mg 3 times daily for now  Hyperlipidemia -Changed to Lipitor  Marijuana abuse -Encourage cessation  Obesity Estimated body mass index is 45.26 kg/m as calculated from the following:   Height as of this encounter: 5\' 5"  (1.651 m).   Weight as of this encounter: 123.4 kg.  Medical Consultants:   Neurology EP   Discharge Exam:   Vitals:   07/27/19 1216 07/27/19 1516  BP: 116/72 127/80  Pulse: 92 91  Resp: 18 18  Temp: 98.4 F (36.9 C) 98.2 F (36.8 C)  SpO2: 99% 98%   Vitals:   07/27/19 0833 07/27/19 1145 07/27/19 1216 07/27/19 1516  BP: 129/84 (!) 121/101 116/72 127/80  Pulse: 87  92 91  Resp: 20  18 18   Temp: 98.1 F (36.7 C)  98.4 F (36.9 C) 98.2 F (36.8 C)  TempSrc: Oral  Oral Oral  SpO2: 99% 100% 99% 98%  Weight:      Height:        General exam: Appears calm and comfortable.   The results of significant diagnostics from this hospitalization (including imaging, microbiology, ancillary and laboratory) are listed below for  reference.     Procedures and Diagnostic Studies:   CT Code Stroke CTA Head W/WO contrast  Result Date: 07/26/2019 CLINICAL DATA:  Initial evaluation for acute left-sided weakness. EXAM: CT ANGIOGRAPHY HEAD AND NECK CT PERFUSION BRAIN TECHNIQUE: Multidetector CT imaging of the head and neck was performed using the standard protocol during bolus administration of intravenous contrast. Multiplanar CT image reconstructions and MIPs were obtained to evaluate the vascular anatomy. Carotid stenosis measurements (when applicable) are obtained utilizing NASCET criteria, using the distal internal carotid diameter as the denominator. Multiphase CT imaging of the brain was performed following IV bolus contrast injection. Subsequent parametric perfusion maps were calculated using RAPID software. CONTRAST:  50mL OMNIPAQUE IOHEXOL 350 MG/ML SOLN, 50mL OMNIPAQUE IOHEXOL 350 MG/ML SOLN COMPARISON:  Prior head CT from earlier the same day. FINDINGS: CTA NECK FINDINGS Aortic arch: Visualized aortic arch normal in caliber with normal 3 vessel morphology. Moderate atherosclerotic change about the arch and origin of the great vessels without hemodynamically significant stenosis. Visualized subclavian arteries patent without stenosis. Right carotid system: Right common carotid artery patent from its origin to the bifurcation without stenosis. Mild scattered mixed plaque about the right bifurcation/proximal right ICA without significant stenosis. Right ICA patent distally to the skull base without stenosis, dissection or occlusion. Left carotid system: Left common carotid artery patent from its origin to the bifurcation without significant stenosis. Mixed plaque about the left bifurcation/proximal left ICA without hemodynamically significant stenosis. Left ICA patent distally to the skull base without stenosis, dissection or occlusion. Vertebral arteries: Both vertebral arteries arise from the subclavian arteries. Focal plaque at  the origin of the right vertebral artery with severe at least 80% ostial stenosis (series 8, image 276). Right vertebral artery otherwise widely patent within the neck. 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 Scant distal reconstitution at the left V3 segment, with attenuated flow as it courses into the cranial vault. 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 Skeleton: No acute osseous abnormality. No worrisome osseous lesions. Moderate to advanced multilevel cervical spondylosis, most notable at C5-6 and C6-7. Chronic grade 1 facet mediated anterolisthesis of C4 on C5. Other neck: No other acute soft tissue abnormality within the neck. No appreciable mass lesion or adenopathy. Upper chest: Mild ground-glass opacity with diffuse interlobular septal thickening seen within the visualized lungs, suggesting mild pulmonary interstitial edema. Visualized upper chest demonstrates no other acute finding. Review of the MIP images confirms the above findings CTA HEAD FINDINGS Anterior circulation: Petrous segments widely patent. Mild scattered atheromatous plaque within the cavernous/supraclinoid ICAs without significant stenosis. ICA termini well perfused. A1 segments patent bilaterally. Normal anterior communicating artery. Anterior cerebral arteries widely patent to their distal aspects. No M1 stenosis or occlusion. Normal MCA bifurcations. On the right, there is abrupt occlusion of the distal left M3 branch (series 8, image 78). Right MCA branches otherwise perfused. No large vessel occlusion seen on the left. Posterior circulation: Dominant right vertebral artery patent to the vertebrobasilar  junction without stenosis. Hypoplastic left vertebral artery attenuated but patent as it courses into the cranial vault, and remains patent to the vertebrobasilar junction. Posterior inferior cerebral arteries patent bilaterally. Basilar  diminutive but patent to its distal aspect without stenosis. Superior cerebral arteries patent bilaterally. Fetal type origin of both PCAs supplied via a robust posterior communicating arteries. Both PCAs well perfused to their distal aspects without stenosis. Venous sinuses: Patent allowing for timing the contrast bolus. Anatomic variants: Fetal type origin of the PCAs with diminutive vertebrobasilar system. Review of the MIP images confirms the above findings CT Brain Perfusion Findings: ASPECTS: 10 CBF (<30%) Volume: 12mL Perfusion (Tmax>6.0s) volume: 22mL Mismatch Volume: 10mL Infarction Location:Acute core infarct seen involving the posterior right MCA distribution, posterior right region. Finding consistent with the right M3 occlusion. 10 cc surrounding ischemic penumbra. IMPRESSION: CTA HEAD AND NECK IMPRESSION: 1. Positive CTA with acute occlusion of a distal right M3 branch. 2. 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 3. Mild-to-moderate atherosclerotic change elsewhere about the major arterial vasculature of the head and neck. No other hemodynamically significant or correctable stenosis. 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 22 cc ischemic penumbra, with mismatch volume of 10 cc. Critical Value/emergent results were called by telephone at the time of interpretation on 07/26/2019 at 3:50 am to provider [facility] , who verbally acknowledged these results. Electronically Signed   By: Rise Mu M.D.   On: 07/26/2019 04:42   CT Code Stroke CTA Neck W/WO contrast  Result Date: 07/26/2019 CLINICAL DATA:  Initial evaluation for acute left-sided weakness. EXAM: CT ANGIOGRAPHY HEAD AND NECK CT PERFUSION BRAIN TECHNIQUE: Multidetector CT  imaging of the head and neck was performed using the standard protocol during bolus administration of intravenous contrast. Multiplanar CT image reconstructions and MIPs were obtained to evaluate the vascular anatomy. Carotid stenosis measurements (when applicable) are obtained utilizing NASCET criteria, using the distal internal carotid diameter as the denominator. Multiphase CT imaging of the brain was performed following IV bolus contrast injection. Subsequent parametric perfusion maps were calculated using RAPID software. CONTRAST:  50mL OMNIPAQUE IOHEXOL 350 MG/ML SOLN, 50mL OMNIPAQUE IOHEXOL 350 MG/ML SOLN COMPARISON:  Prior head CT from earlier the same day. FINDINGS: CTA NECK FINDINGS Aortic arch: Visualized aortic arch normal in caliber with normal 3 vessel morphology. Moderate atherosclerotic change about the arch and origin of the great vessels without hemodynamically significant stenosis. Visualized subclavian arteries patent without stenosis. Right carotid system: Right common carotid artery patent from its origin to the bifurcation without stenosis. Mild scattered mixed plaque about the right bifurcation/proximal right ICA without significant stenosis. Right ICA patent distally to the skull base without stenosis, dissection or occlusion. Left carotid system: Left common carotid artery patent from its origin to the bifurcation without significant stenosis. Mixed plaque about the left bifurcation/proximal left ICA without hemodynamically significant stenosis. Left ICA patent distally to the skull base without stenosis, dissection or occlusion. Vertebral arteries: Both vertebral arteries arise from the subclavian arteries. Focal plaque at the origin of the right vertebral artery with severe at least 80% ostial stenosis (series 8, image 276). Right vertebral artery otherwise widely patent within the neck. 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 Scant distal  reconstitution at the left V3 segment, with attenuated flow as it courses into the cranial vault. 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 Skeleton: No acute osseous abnormality. No worrisome osseous lesions. Moderate to advanced multilevel cervical spondylosis, most notable at C5-6 and C6-7. Chronic grade 1 facet mediated anterolisthesis of C4 on C5. Other neck: No other acute soft tissue abnormality within the neck. No appreciable mass lesion or adenopathy. Upper chest: Mild ground-glass opacity with diffuse interlobular septal thickening seen within the visualized lungs, suggesting mild pulmonary interstitial edema. Visualized upper chest demonstrates no other acute finding. Review of the MIP images confirms the above findings CTA HEAD FINDINGS Anterior circulation: Petrous segments widely patent. Mild scattered atheromatous plaque within the cavernous/supraclinoid ICAs without significant stenosis. ICA termini well perfused. A1 segments patent bilaterally. Normal anterior communicating artery. Anterior cerebral arteries widely patent to their distal aspects. No M1 stenosis or occlusion. Normal MCA bifurcations. On the right, there is abrupt occlusion of the distal left M3 branch (series 8, image 78). Right MCA branches otherwise perfused. No large vessel occlusion seen on the left. Posterior circulation: Dominant right vertebral artery patent to the vertebrobasilar junction without stenosis. Hypoplastic left vertebral artery attenuated but patent as it courses into the cranial vault, and remains patent to the vertebrobasilar junction. Posterior inferior cerebral arteries patent bilaterally. Basilar diminutive but patent to its distal aspect without stenosis. Superior cerebral arteries patent bilaterally. Fetal type origin of both PCAs supplied via a robust posterior communicating arteries. Both PCAs well perfused to their distal aspects without stenosis. Venous  sinuses: Patent allowing for timing the contrast bolus. Anatomic variants: Fetal type origin of the PCAs with diminutive vertebrobasilar system. Review of the MIP images confirms the above findings CT Brain Perfusion Findings: ASPECTS: 10 CBF (<30%) Volume: 41mL Perfusion (Tmax>6.0s) volume: 70mL Mismatch Volume: 29mL Infarction Location:Acute core infarct seen involving the posterior right MCA distribution, posterior right region. Finding consistent with the right M3 occlusion. 10 cc surrounding ischemic penumbra. IMPRESSION: CTA HEAD AND NECK IMPRESSION: 1. Positive CTA with acute occlusion of a distal right M3 branch. 2. 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 3. Mild-to-moderate atherosclerotic change elsewhere about the major arterial vasculature of the head and neck. No other hemodynamically significant or correctable stenosis. 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 22 cc ischemic penumbra, with mismatch volume of 10 cc. Critical Value/emergent results were called by telephone at the time of interpretation on 07/26/2019 at 3:50 am to provider [facility] , who verbally acknowledged these results. Electronically Signed   By: Jeannine Boga M.D.   On: 07/26/2019 04:42   MR BRAIN WO CONTRAST  Result Date: 07/26/2019 CLINICAL DATA:  Stroke follow-up. EXAM: MRI HEAD WITHOUT CONTRAST TECHNIQUE: Multiplanar, multiecho pulse sequences of the brain and surrounding structures were obtained without intravenous contrast. COMPARISON:  CTA head and CT perfusion earlier today FINDINGS: Brain: Acute infarct right parietal lobe. This appears to involve primarily the sensory cortex but also some of the motor cortex. No associated hemorrhage Ventricle size  normal. Negative for mass lesion. No significant chronic ischemia. Vascular: Normal arterial flow voids. Skull and upper cervical spine: Negative Sinuses/Orbits: Mild mucosal edema paranasal sinuses. Disconjugate gaze. No orbital mass. Other: None IMPRESSION: Acute infarct right parietal lobe which appears to predominantly involve the sensory cortex  with a smaller amount of motor cortex involvement. No associated hemorrhage. No chronic ischemia. Electronically Signed   By: Marlan Palau M.D.   On: 07/26/2019 10:01   EP PPM/ICD IMPLANT  Result Date: 07/27/2019 SURGEON:  Hillis Range, MD   PREPROCEDURE DIAGNOSIS:  Cryptogenic Stroke   POSTPROCEDURE DIAGNOSIS:  Cryptogenic Stroke    PROCEDURES:  1. Implantable loop recorder implantation   INTRODUCTION:  Philip Gregory is a 69 y.o. male with a history of unexplained stroke who presents today for implantable loop implantation.  The patient has had a cryptogenic stroke.  Despite an extensive workup by neurology, no reversible causes have been identified.  he has worn telemetry during which he did not have arrhythmias.  There is significant concern for possible atrial fibrillation as the cause for the patients stroke.  The patient therefore presents today for implantable loop implantation.   DESCRIPTION OF PROCEDURE:  Informed written consent was obtained.  The patient required no sedation for the procedure today.  The patients left chest was prepped and draped. Mapping over the patient's chest was performed to identify the appropriate ILR site.  This area was found to be the left parasternal region over the 3rd-4th intercostal space.  The skin overlying this region was infiltrated with lidocaine for local analgesia.  A 0.5-cm incision was made at the implant site.  A subcutaneous ILR pocket was fashioned using a combination of sharp and blunt dissection.  A Medtronic Reveal Linq model [id number] implantable loop recorder was then placed into the pocket R waves were very  prominent and measured > 0.2 mV. EBL<1 ml.  Steri- Strips and a sterile dressing were then applied.  There were no early apparent complications.   CONCLUSIONS:  1. Successful implantation of a Medtronic Reveal LINQ implantable loop recorder for cryptogenic stroke  2. No early apparent complications. Hillis Range MD, [facility] 07/27/2019 2:39 PM   CT Code Stroke Cerebral Perfusion with contrast  Result Date: 07/26/2019 CLINICAL DATA:  Initial evaluation for acute left-sided weakness. EXAM: CT ANGIOGRAPHY HEAD AND NECK CT PERFUSION BRAIN TECHNIQUE: Multidetector CT imaging of the head and neck was performed using the standard protocol during bolus administration of intravenous contrast. Multiplanar CT image reconstructions and MIPs were obtained to evaluate the vascular anatomy. Carotid stenosis measurements (when applicable) are obtained utilizing NASCET criteria, using the distal internal carotid diameter as the denominator. Multiphase CT imaging of the brain was performed following IV bolus contrast injection. Subsequent parametric perfusion maps were calculated using RAPID software. CONTRAST:  12mL OMNIPAQUE IOHEXOL 350 MG/ML SOLN, 56mL OMNIPAQUE IOHEXOL 350 MG/ML SOLN COMPARISON:  Prior head CT from earlier the same day. FINDINGS: CTA NECK FINDINGS Aortic arch: Visualized aortic arch normal in caliber with normal 3 vessel morphology. Moderate atherosclerotic change about the arch and origin of the great vessels without hemodynamically significant stenosis. Visualized subclavian arteries patent without stenosis. Right carotid system: Right common carotid artery patent from its origin to the bifurcation without stenosis. Mild scattered mixed plaque about the right bifurcation/proximal right ICA without significant stenosis. Right ICA patent distally to the skull base without stenosis, dissection or occlusion. Left carotid system: Left common carotid artery patent from its origin to the bifurcation without significant  stenosis. Mixed plaque about the left bifurcation/proximal left ICA without hemodynamically significant stenosis. Left ICA patent distally to the skull base without stenosis, dissection or occlusion. Vertebral arteries: Both vertebral arteries arise from the subclavian arteries. Focal plaque at the origin of the right vertebral artery with severe at  least 80% ostial stenosis (series 8, image 276). Right vertebral artery otherwise widely patent within the neck. 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 Scant distal reconstitution at the left V3 segment, with attenuated flow as it courses into the cranial vault. 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 Skeleton: No acute osseous abnormality. No worrisome osseous lesions. Moderate to advanced multilevel cervical spondylosis, most notable at C5-6 and C6-7. Chronic grade 1 facet mediated anterolisthesis of C4 on C5. Other neck: No other acute soft tissue abnormality within the neck. No appreciable mass lesion or adenopathy. Upper chest: Mild ground-glass opacity with diffuse interlobular septal thickening seen within the visualized lungs, suggesting mild pulmonary interstitial edema. Visualized upper chest demonstrates no other acute finding. Review of the MIP images confirms the above findings CTA HEAD FINDINGS Anterior circulation: Petrous segments widely patent. Mild scattered atheromatous plaque within the cavernous/supraclinoid ICAs without significant stenosis. ICA termini well perfused. A1 segments patent bilaterally. Normal anterior communicating artery. Anterior cerebral arteries widely patent to their distal aspects. No M1 stenosis or occlusion. Normal MCA bifurcations. On the right, there is abrupt occlusion of the distal left M3 branch (series 8, image 78). Right MCA branches otherwise perfused. No large vessel occlusion seen on the left. Posterior circulation: Dominant  right vertebral artery patent to the vertebrobasilar junction without stenosis. Hypoplastic left vertebral artery attenuated but patent as it courses into the cranial vault, and remains patent to the vertebrobasilar junction. Posterior inferior cerebral arteries patent bilaterally. Basilar diminutive but patent to its distal aspect without stenosis. Superior cerebral arteries patent bilaterally. Fetal type origin of both PCAs supplied via a robust posterior communicating arteries. Both PCAs well perfused to their distal aspects without stenosis. Venous sinuses: Patent allowing for timing the contrast bolus. Anatomic variants: Fetal type origin of the PCAs with diminutive vertebrobasilar system. Review of the MIP images confirms the above findings CT Brain Perfusion Findings: ASPECTS: 10 CBF (<30%) Volume: 12mL Perfusion (Tmax>6.0s) volume: 22mL Mismatch Volume: 10mL Infarction Location:Acute core infarct seen involving the posterior right MCA distribution, posterior right region. Finding consistent with the right M3 occlusion. 10 cc surrounding ischemic penumbra. IMPRESSION: CTA HEAD AND NECK IMPRESSION: 1. Positive CTA with acute occlusion of a distal right M3 branch. 2. 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 3. Mild-to-moderate atherosclerotic change elsewhere about the major arterial vasculature of the head and neck. No other hemodynamically significant or correctable stenosis. 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 22 cc ischemic penumbra, with mismatch volume of 10 cc. Critical Value/emergent results were called by telephone at the time of interpretation on 07/26/2019 at 3:50 am to provider [facility] , who verbally acknowledged these results.  Electronically Signed   By: Rise Mu M.D.   On: 07/26/2019 04:42   ECHOCARDIOGRAM COMPLETE  Result Date: 07/26/2019    ECHOCARDIOGRAM REPORT   Patient Name:   [facility] Date of Exam: 07/26/2019 Medical Rec #:  [id number]   Height:       65.0 in Accession #:    [id number]  Weight:       272.0 lb Date of Birth:  05-Jun-1950  BSA:          2.254 m Patient Age:    69  years    BP:           115/73 mmHg Patient Gender: M           HR:           91 bpm. Exam Location:  Inpatient Procedure: 2D Echo, Cardiac Doppler and Color Doppler Indications:    Stroke 434.91/I163.9  History:        Patient has no prior history of Echocardiogram examinations.                 CAD; Risk Factors:Hypertension and Dyslipidemia.  Sonographer:    Ross Ludwig RDCS (AE) Referring Phys: [id number] TIMOTHY S OPYD  Sonographer Comments: Patient is morbidly obese. IMPRESSIONS  1. Left ventricular ejection fraction, by estimation, is 50 to 55%. The left ventricle has low normal function. The left ventricle has no regional wall motion abnormalities. There is severe concentric left ventricular hypertrophy. Left ventricular diastolic parameters are consistent with Grade II diastolic dysfunction (pseudonormalization).  2. Right ventricular systolic function is normal. The right ventricular size is normal. There is normal pulmonary artery systolic pressure.  3. Right atrial size was moderately dilated.  4. The mitral valve is normal in structure. Mild mitral valve regurgitation. No evidence of mitral stenosis.  5. The aortic valve is tricuspid. Aortic valve regurgitation is not visualized. No aortic stenosis is present.  6. The inferior vena cava is normal in size with greater than 50% respiratory variability, suggesting right atrial pressure of 3 mmHg. FINDINGS  Left Ventricle: Left ventricular ejection fraction, by estimation, is 50 to 55%. The left ventricle has low normal function. The left ventricle has no regional wall motion  abnormalities. The left ventricular internal cavity size was normal in size. There is severe concentric left ventricular hypertrophy. Left ventricular diastolic parameters are consistent with Grade II diastolic dysfunction (pseudonormalization). Right Ventricle: The right ventricular size is normal. No increase in right ventricular wall thickness. Right ventricular systolic function is normal. There is normal pulmonary artery systolic pressure. The tricuspid regurgitant velocity is 2.06 m/s, and  with an assumed right atrial pressure of 3 mmHg, the estimated right ventricular systolic pressure is 20.0 mmHg. Left Atrium: Left atrial size was normal in size. Right Atrium: Right atrial size was moderately dilated. Pericardium: There is no evidence of pericardial effusion. Mitral Valve: The mitral valve is normal in structure. Normal mobility of the mitral valve leaflets. Mild mitral valve regurgitation. No evidence of mitral valve stenosis. MV peak gradient, 4.7 mmHg. The mean mitral valve gradient is 2.0 mmHg. Tricuspid Valve: The tricuspid valve is normal in structure. Tricuspid valve regurgitation is trivial. No evidence of tricuspid stenosis. Aortic Valve: The aortic valve is tricuspid. Aortic valve regurgitation is not visualized. No aortic stenosis is present. Aortic valve mean gradient measures 3.0 mmHg. Aortic valve peak gradient measures 4.8 mmHg. Aortic valve area, by VTI measures 3.20 cm. Pulmonic Valve: The pulmonic valve was normal in structure. Pulmonic valve regurgitation is not visualized. No evidence of pulmonic stenosis. Aorta: The aortic root is normal in size and structure. Venous: The inferior vena cava is normal in size with greater than 50% respiratory variability, suggesting right atrial pressure of 3 mmHg. IAS/Shunts: No atrial level shunt detected by color flow Doppler.  LEFT VENTRICLE PLAX 2D LVIDd:         3.61 cm      Diastology LVIDs:         2.71 cm      LV e' lateral: 6.00 cm/s  LV PW:          1.71 cm      LV e' medial:  3.83 cm/s LV IVS:        2.11 cm LVOT diam:     2.10 cm LV SV:         63 LV SV Index:   28 LVOT Area:     3.46 cm  LV Volumes (MOD) LV vol d, MOD A2C: 84.8 ml LV vol d, MOD A4C: 121.0 ml LV vol s, MOD A2C: 70.0 ml LV vol s, MOD A4C: 80.2 ml LV SV MOD A2C:     14.8 ml LV SV MOD A4C:     121.0 ml LV SV MOD BP:      26.0 ml RIGHT VENTRICLE            IVC RV Basal diam:  3.08 cm    IVC diam: 1.51 cm RV S prime:     9.75 cm/s TAPSE (M-mode): 1.4 cm LEFT ATRIUM             Index       RIGHT ATRIUM           Index LA diam:        4.00 cm 1.77 cm/m  RA Area:     23.40 cm LA Vol (A2C):   59.0 ml 26.17 ml/m RA Volume:   78.30 ml  34.73 ml/m LA Vol (A4C):   53.6 ml 23.78 ml/m LA Biplane Vol: 61.2 ml 27.15 ml/m  AORTIC VALVE AV Area (Vmax):    3.65 cm AV Area (Vmean):   3.27 cm AV Area (VTI):     3.20 cm AV Vmax:           110.00 cm/s AV Vmean:          81.200 cm/s AV VTI:            0.196 m AV Peak Grad:      4.8 mmHg AV Mean Grad:      3.0 mmHg LVOT Vmax:         116.00 cm/s LVOT Vmean:        76.600 cm/s LVOT VTI:          0.181 m LVOT/AV VTI ratio: 0.92  AORTA Ao Root diam: 3.10 cm MITRAL VALVE             TRICUSPID VALVE MV Area (PHT): 3.28 cm  TR Peak grad:   17.0 mmHg MV Peak grad:  4.7 mmHg  TR Vmax:        206.00 cm/s MV Mean grad:  2.0 mmHg MV Vmax:       1.08 m/s  SHUNTS MV Vmean:      67.5 cm/s Systemic VTI:  0.18 m                          Systemic Diam: 2.10 cm Chilton Si MD Electronically signed by Chilton Si MD Signature Date/Time: 07/26/2019/2:51:39 PM    Final    CT HEAD CODE STROKE WO CONTRAST  Result Date: 07/26/2019 CLINICAL DATA:  Code stroke. Initial evaluation for acute left-sided weakness, numbness, possible stroke. EXAM: CT HEAD WITHOUT CONTRAST TECHNIQUE: Contiguous axial images were obtained from the base of the skull through the vertex without intravenous contrast. COMPARISON:  None available. FINDINGS: Brain: Cerebral volume within normal  limits for age. No acute intracranial hemorrhage. No acute large vessel territory infarct. No mass lesion, mass  effect, or midline shift. No hydrocephalus. No extra-axial fluid collection. Vascular: No hyperdense vessel. Scattered vascular calcifications noted within the carotid siphons. Punctate hyperdensity seen on a single slice at the right sylvian fissure favored to reflect a small focal plaque (series 3, image 20). Skull: Scalp soft tissues within normal limits.  Calvarium intact. Sinuses/Orbits: Globes and orbital soft tissues demonstrate no acute finding. Chronic mucoperiosteal thickening noted throughout the paranasal sinuses. Mastoid air cells are clear. Other: None. ASPECTS [facility] Stroke Program Early CT Score) - Ganglionic level infarction (caudate, lentiform nuclei, internal capsule, insula, M1-M3 cortex): 7 - Supraganglionic infarction (M4-M6 cortex): 3 Total score (0-10 with 10 being normal): 10 IMPRESSION: 1. No acute intracranial infarct or other abnormality. 2. ASPECTS is 10. These results were communicated to Dr. Wilford Corner at 3:36 amon 3/11/2021by text page via the [facility] messaging system. Electronically Signed   By: Rise Mu M.D.   On: 07/26/2019 03:39   VAS [place] LOWER EXTREMITY VENOUS (DVT)  Result Date: 07/27/2019  Lower Venous DVTStudy Indications: Stroke.  Limitations: Body habitus and poor ultrasound/tissue interface. Comparison Study: no prior Performing Technologist: Blanch Media RVS  Examination Guidelines: A complete evaluation includes B-mode imaging, spectral Doppler, color Doppler, and power Doppler as needed of all accessible portions of each vessel. Bilateral testing is considered an integral part of a complete examination. Limited examinations for reoccurring indications may be performed as noted. The reflux portion of the exam is performed with the patient in reverse Trendelenburg.  +---------+---------------+---------+-----------+----------+--------------+ RIGHT     CompressibilityPhasicitySpontaneityPropertiesThrombus Aging +---------+---------------+---------+-----------+----------+--------------+ CFV      Full           Yes      Yes                                 +---------+---------------+---------+-----------+----------+--------------+ SFJ      Full                                                        +---------+---------------+---------+-----------+----------+--------------+ FV Prox  Full                                                        +---------+---------------+---------+-----------+----------+--------------+ FV Mid   Full                                                        +---------+---------------+---------+-----------+----------+--------------+ FV DistalFull                                                        +---------+---------------+---------+-----------+----------+--------------+ PFV      Full                                                        +---------+---------------+---------+-----------+----------+--------------+  POP      Full           Yes      Yes                                 +---------+---------------+---------+-----------+----------+--------------+ PTV      Full                                                        +---------+---------------+---------+-----------+----------+--------------+ PERO                                                  Not visualized +---------+---------------+---------+-----------+----------+--------------+   +---------+---------------+---------+-----------+----------+--------------+ LEFT     CompressibilityPhasicitySpontaneityPropertiesThrombus Aging +---------+---------------+---------+-----------+----------+--------------+ CFV      Full           Yes      Yes                                 +---------+---------------+---------+-----------+----------+--------------+ SFJ      Full                                                         +---------+---------------+---------+-----------+----------+--------------+ FV Prox  Full                                                        +---------+---------------+---------+-----------+----------+--------------+ FV Mid   Full                                                        +---------+---------------+---------+-----------+----------+--------------+ FV DistalFull                                                        +---------+---------------+---------+-----------+----------+--------------+ PFV      Full                                                        +---------+---------------+---------+-----------+----------+--------------+ POP      Full           Yes      Yes                                 +---------+---------------+---------+-----------+----------+--------------+  PTV      Full                                                        +---------+---------------+---------+-----------+----------+--------------+ PERO                                                  Not visualized +---------+---------------+---------+-----------+----------+--------------+     Summary: BILATERAL: - No evidence of deep vein thrombosis seen in the lower extremities, bilaterally.   *See table(s) above for measurements and observations.    Preliminary      Labs:   Basic Metabolic Panel: Recent Labs  Lab 07/26/19 0318 07/26/19 0328  NA 139 140  K 3.9 4.0  CL 105 103  CO2 24  --   GLUCOSE 98 92  BUN 16 19  CREATININE 1.27* 1.20  CALCIUM 9.0  --    GFR Estimated Creatinine Clearance: 73.9 mL/min (by C-G formula based on SCr of 1.2 mg/dL). Liver Function Tests: Recent Labs  Lab 07/26/19 0318  AST 20  ALT 18  ALKPHOS 98  BILITOT 0.6  PROT 6.8  ALBUMIN 3.3*   No results for input(s): LIPASE, AMYLASE in the last 168 hours. No results for input(s): AMMONIA in the last 168 hours. Coagulation profile Recent Labs  Lab  07/26/19 0318  INR 1.1    CBC: Recent Labs  Lab 07/26/19 0318 07/26/19 0328  WBC 10.2  --   NEUTROABS 7.2  --   HGB 13.7 15.0  HCT 42.7 44.0  MCV 90.1  --   PLT 288  --    Cardiac Enzymes: No results for input(s): CKTOTAL, CKMB, CKMBINDEX, TROPONINI in the last 168 hours. BNP: Invalid input(s): POCBNP CBG: Recent Labs  Lab 07/26/19 0318  GLUCAP 97   D-Dimer No results for input(s): DDIMER in the last 72 hours. Hgb A1c Recent Labs    07/26/19 0726  HGBA1C 6.0*   Lipid Profile Recent Labs    07/26/19 0727  CHOL 123  HDL 45  LDLCALC 70  TRIG 39  CHOLHDL 2.7   Thyroid function studies No results for input(s): TSH, T4TOTAL, T3FREE, THYROIDAB in the last 72 hours.  Invalid input(s): FREET3 Anemia work up No results for input(s): VITAMINB12, FOLATE, FERRITIN, TIBC, IRON, RETICCTPCT in the last 72 hours. Microbiology Recent Results (from the past 240 hour(s))  SARS CORONAVIRUS 2 (TAT 6-24 HRS) Nasopharyngeal Nasopharyngeal Swab     Status: None   Collection Time: 07/26/19  4:09 AM   Specimen: Nasopharyngeal Swab  Result Value Ref Range Status   SARS Coronavirus 2 NEGATIVE NEGATIVE Final    Comment: (NOTE) SARS-CoV-2 target nucleic acids are NOT DETECTED. The SARS-CoV-2 RNA is generally detectable in upper and lower respiratory specimens during the acute phase of infection. Negative results do not preclude SARS-CoV-2 infection, do not rule out co-infections with other pathogens, and should not be used as the sole basis for treatment or other patient management decisions. Negative results must be combined with clinical observations, patient history, and epidemiological information. The expected result is Negative. Fact Sheet for Patients: HairSlick.nohttps://www.fda.gov/media/138098/download Fact Sheet for Healthcare Providers: quierodirigir.comhttps://www.fda.gov/media/138095/download This test is not yet approved or cleared by the [place] FDA and  has been authorized for  detection and/or diagnosis of SARS-CoV-2 by FDA under an Emergency Use Authorization (EUA). This EUA will remain  in effect (meaning this test can be used) for the duration of the COVID-19 declaration under Section 56 4(b)(1) of the Act, 21 U.S.C. section 360bbb-3(b)(1), unless the authorization is terminated or revoked sooner. Performed at [facility], [address], [place], [place] [address]      Discharge Instructions:   Discharge Instructions    Ambulatory referral to Neurology   Complete by: As directed    An appointment is requested in approximately: 6 weeks   Diet general   Complete by: As directed    Discharge instructions   Complete by: As directed    Marijuana cessation Home health 24 hour care by family   Increase activity slowly   Complete by: As directed      Allergies as of 07/27/2019   No Known Allergies     Medication List    STOP taking these medications   aspirin EC 81 MG tablet Replaced by: aspirin 325 MG tablet   hydrOXYzine 25 MG tablet Commonly known as: ATARAX/VISTARIL   lisinopril-hydrochlorothiazide 20-12.5 MG tablet Commonly known as: ZESTORETIC   metoprolol succinate 50 MG 24 hr tablet Commonly known as: TOPROL-XL   rosuvastatin 20 MG tablet Commonly known as: CRESTOR     TAKE these medications   aspirin 325 MG tablet Take 1 tablet (325 mg total) by mouth daily. Start taking on: July 28, 2019 Replaces: aspirin EC 81 MG tablet   atorvastatin 80 MG tablet Commonly known as: LIPITOR Take 1 tablet (80 mg total) by mouth daily at 6 PM.   clopidogrel 75 MG tablet Commonly known as: PLAVIX Take 1 tablet (75 mg total) by mouth daily. Start taking on: July 28, 2019   midodrine 5 MG tablet Commonly known as: PROAMATINE Take 1 tablet (5 mg total) by mouth 3 (three) times daily with meals.   omeprazole 20 MG capsule Commonly known as: PRILOSEC Take 20 mg by mouth 2 (two) times daily before a meal.      Follow-up  Information    [facility]. Schedule an appointment as soon as possible for a visit in 4 day(s).   Specialty: Neurology Contact information: [address] [place] [address] [phone]           Time coordinating discharge: 25 minutes  Signed:  Joseph Art DO  [facility] 07/27/2019, 3:53 PM

## 2019-07-27 NOTE — TOC Initial Note (Signed)
Transition of Care [facility]) - Initial/Assessment Note    Patient Details  Name: Philip Gregory MRN: [id number] Date of Birth: 1950-07-26  [facility] CM/SW Contact:    Marilu Favre, RN Phone Number: 07/27/2019, 3:57 PM  Clinical Narrative:                 Patient from home with nephew , has cane .  Offered choice for home health agency.   Referral given to [facility] with [facility]   Expected Discharge Plan: [place] Barriers to Discharge: No Barriers Identified   Patient Goals and CMS Choice Patient states their goals for this hospitalization and ongoing recovery are:: to return to home CMS Medicare.gov Compare Post Acute Care list provided to:: Patient Choice offered to / list presented to : Patient  Expected Discharge Plan and Services Expected Discharge Plan: [place]   Discharge Planning Services: CM Consult Post Acute Care Choice: Home Health   Expected Discharge Date: 07/27/19               DME Arranged: N/A         HH Arranged: PT, OT HH Agency: Other - See comment([facility]) Date HH Agency Contacted: 07/27/19 Time [place]: [id number] Representative spoke with at [place]: [place] Arrangements/Services   Lives with:: Other (Comment)(nephew) Patient language and need for interpreter reviewed:: Yes Do you feel safe going back to the place where you live?: Yes      Need for Family Participation in Patient Care: Yes (Comment) Care giver support system in place?: Yes (comment) Current home services: DME Criminal Activity/Legal Involvement Pertinent to Current Situation/Hospitalization: No - Comment as needed  Activities of Daily Living Home Assistive Devices/Equipment: Cane (specify quad or straight)(straight) ADL Screening (condition at time of admission) Patient's cognitive ability adequate to safely complete daily activities?: Yes Is the patient deaf or have difficulty  hearing?: No Does the patient have difficulty seeing, even when wearing glasses/contacts?: Yes Does the patient have difficulty concentrating, remembering, or making decisions?: No Patient able to express need for assistance with ADLs?: Yes Does the patient have difficulty dressing or bathing?: Yes Independently performs ADLs?: No Communication: Independent Dressing (OT): Needs assistance Is this a change from baseline?: Change from baseline, expected to last >3 days Grooming: Needs assistance Is this a change from baseline?: Change from baseline, expected to last >3 days Feeding: Independent Bathing: Needs assistance Is this a change from baseline?: Change from baseline, expected to last >3 days Toileting: Needs assistance Is this a change from baseline?: Change from baseline, expected to last >3days In/Out Bed: Needs assistance Is this a change from baseline?: Change from baseline, expected to last >3 days Walks in Home: Independent with device (comment)(Cane) Does the patient have difficulty walking or climbing stairs?: No Weakness of Legs: Left Weakness of Arms/Hands: Left  Permission Sought/Granted   Permission granted to share information with : No              Emotional Assessment Appearance:: Appears stated age Attitude/Demeanor/Rapport: Engaged Affect (typically observed): Accepting Orientation: : Oriented to Self, Oriented to Place, Oriented to  Time, Oriented to Situation Alcohol / Substance Use: Not Applicable Psych Involvement: No (comment)  Admission diagnosis:  Acute left-sided weakness [R53.1] Cerebrovascular accident (CVA), unspecified mechanism ([place]) [I63.9] Patient Active Problem List   Diagnosis Date Noted  . Acute ischemic right MCA stroke ([place]) 07/26/2019  . CAD (coronary artery  disease) 07/26/2019  . Essential hypertension 07/26/2019  . Hyperlipidemia 07/26/2019   PCP:  System, Pcp Not In Pharmacy:   [facility] [address] [place], [place] -  [address] [place] [address] Phone: [phone] Fax: [phone]     Social Determinants of Health (SDOH) Interventions    Readmission Risk Interventions No flowsheet data found.

## 2019-08-09 ENCOUNTER — Ambulatory Visit: Payer: Medicare Other

## 2019-08-31 ENCOUNTER — Ambulatory Visit (INDEPENDENT_AMBULATORY_CARE_PROVIDER_SITE_OTHER): Payer: Medicare Other | Admitting: *Deleted

## 2019-08-31 DIAGNOSIS — Z950 Presence of cardiac pacemaker: Secondary | ICD-10-CM | POA: Diagnosis not present

## 2019-08-31 LAB — CUP PACEART REMOTE DEVICE CHECK
Date Time Interrogation Session: 20210415165354
Implantable Pulse Generator Implant Date: 20210312

## 2019-08-31 NOTE — Progress Notes (Signed)
ILR Remote 

## 2019-09-12 ENCOUNTER — Inpatient Hospital Stay: Payer: Medicare Other | Admitting: Adult Health

## 2019-09-12 NOTE — Progress Notes (Deleted)
[facility] [address] [place]. [place] [address] [phone]       HOSPITAL FOLLOW UP NOTE  [name] Date of Birth:  [birth year] Medical Record Number:  [id number]   Reason for Referral:  hospital stroke follow up    SUBJECTIVE:   CHIEF COMPLAINT:  No chief complaint on file.   HPI:   [name] is a 69 y.o. male with history of anemia, diabetes, hyperlipidemia, hypertension, coronary artery disease, gastroesophageal reflux disease and morbid obesity who presented on 07/26/2019 with L sided weakness and hemisensory deficit.  Stroke work-up revealed right parietal lobe infarct embolic secondary to unclear source.  Loop recorder placed to rule out atrial fibrillation as potential stroke etiology.  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 Roda Shutters.  History of HTN with acute neurologic worsening with hypotension therefore initiated midodrine 5 mg 3 times daily.  LDL 70 and recommended change of Crestor to atorvastatin 80 mg daily.  Controlled DM with A1c 6.0.  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  Residual deficits of mild dysarthria and LUE weakness and discharged home with recommendation of home health therapies.  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 ASPECTS 10.     CTA head & neck distal R M3 branch occlusion. R VA origin 80% stenosis. L VA hypoplastic w/ occlusion in neck and reocclusion V3. Mild to mod atherosclerosis throughout.   CT perfusion 12cc posterior R frontal lobe MCA infarct. 22cc penumbra. 10cc mismatch.   MRI  R parietal lobe infarct   2D Echo EF 50-55%. No source of embolus   LE venous doppler no DVT   Loop  recorder placed to rule out afib  LDL 70  HgbA1c 6.0  Lovenox 40 mg sq daily for VTE prophylaxis  aspirin 81 mg daily prior to admission, now on aspirin 325 mg daily and clopidogrel 75 mg daily. Given large vessel disease, continue DAPT x 3 months then plavix alone.    Therapy recommendations:  HH PT OT  Disposition:  Likely home    Today, 09/12/2019, Philip Gregory is being seen for hospital follow-up.      ROS:   14 system review of systems performed and negative with exception of ***  PMH:  Past Medical History:  Diagnosis Date  . CAD (coronary artery disease) 07/26/2019  . Essential hypertension 07/26/2019  . Hyperlipidemia 07/26/2019    PSH:  Past Surgical History:  Procedure Laterality Date  . LOOP RECORDER INSERTION N/A 07/27/2019   Procedure: LOOP RECORDER INSERTION;  Surgeon: Hillis Range, MD;  Location: [facility] INVASIVE CV [facility];  Service: Cardiovascular;  Laterality: N/A;    Social History:  Social History   Socioeconomic History  . Marital status: Single    Spouse name: Not on file  . Number of children: Not on file  . Years of education: Not on file  . Highest education level: Not on file  Occupational History  . Not on file  Tobacco Use  . Smoking status: Former Smoker    Quit date: 1995    Years since quitting: 26.3  . Smokeless tobacco: Never Used  Substance and Sexual Activity  . Alcohol use: Yes  Alcohol/week: 2.0 standard drinks    Types: 2 Cans of beer per week  . Drug use: Not on file  . Sexual activity: Not on file  Other Topics Concern  . Not on file  Social History Narrative  . Not on file   Social Determinants of Health   Financial Resource Strain:   . Difficulty of Paying Living Expenses:   Food Insecurity:   . Worried About Charity fundraiser in the Last Year:   . Arboriculturist in the Last Year:   Transportation Needs:   . Film/video editor (Medical):   Marland Kitchen Lack of Transportation (Non-Medical):   Physical Activity:   .  Days of Exercise per Week:   . Minutes of Exercise per Session:   Stress:   . Feeling of Stress :   Social Connections:   . Frequency of Communication with Friends and Family:   . Frequency of Social Gatherings with Friends and Family:   . Attends Religious Services:   . Active Member of Clubs or Organizations:   . Attends Archivist Meetings:   Marland Kitchen Marital Status:   Intimate Partner Violence:   . Fear of Current or Ex-Partner:   . Emotionally Abused:   Marland Kitchen Physically Abused:   . Sexually Abused:     Family History:  Family History  Problem Relation Age of Onset  . Hypertension Mother   . Diabetes Mother   . Cancer Father   . Hypertension Sister     Medications:   Current Outpatient Medications on File Prior to Visit  Medication Sig Dispense Refill  . aspirin 325 MG tablet Take 1 tablet (325 mg total) by mouth daily.    Marland Kitchen atorvastatin (LIPITOR) 80 MG tablet Take 1 tablet (80 mg total) by mouth daily at 6 PM. 30 tablet 0  . clopidogrel (PLAVIX) 75 MG tablet Take 1 tablet (75 mg total) by mouth daily. 90 tablet 0  . midodrine (PROAMATINE) 5 MG tablet Take 1 tablet (5 mg total) by mouth 3 (three) times daily with meals. 90 tablet 0  . omeprazole (PRILOSEC) 20 MG capsule Take 20 mg by mouth 2 (two) times daily before a meal.     No current facility-administered medications on file prior to visit.    Allergies:  No Known Allergies    OBJECTIVE:  Vitals  There were no vitals filed for this visit. There is no height or weight on file to calculate BMI. No exam data present   Post stroke PHQ 2 9  No flowsheet data found.   Physical exam  General: well developed, well nourished, seated, in no evident distress Head: head normocephalic and atraumatic.   Neck: supple with no carotid or supraclavicular bruits Cardiovascular: regular rate and rhythm, no murmurs Musculoskeletal: no deformity Skin:  no rash/petichiae Vascular:  Normal pulses all extremities     Neurologic Exam Mental Status: Awake and fully alert. Oriented to place and time. Recent and remote memory intact. Attention span, concentration and fund of knowledge appropriate. Mood and affect appropriate.  Cranial Nerves: Fundoscopic exam reveals sharp disc margins. Pupils equal, briskly reactive to light. Extraocular movements full without nystagmus. Visual fields full to confrontation. Hearing intact. Facial sensation intact. Face, tongue, palate moves normally and symmetrically.  Motor: Normal bulk and tone. Normal strength in all tested extremity muscles. Sensory.: intact to touch , pinprick , position and vibratory sensation.  Coordination: Rapid alternating movements normal in all extremities. Finger-to-nose and heel-to-shin performed accurately  bilaterally. Gait and Station: Arises from chair without difficulty. Stance is normal. Gait demonstrates normal stride length and balance Reflexes: 1+ and symmetric. Toes downgoing.     NIHSS  *** Modified Rankin  ***      ASSESSMENT: Philip Gregory is a 69 y.o. year old male presented with left-sided weakness and hemisensory deficit on 07/26/2019 with stroke work-up revealing right parietal lobe infarct embolic secondary to unknown source therefore loop recorder placed. Vascular risk factors include HTN, HLD, DM, CAD, THC use and morbid obesity.      PLAN:  1. Cryptogenic right parietal stroke: Continue aspirin 325 mg daily and clopidogrel 75 mg daily  and atorvastatin 80 mg daily for secondary stroke prevention.  Continue DAPT for total of 3 month duration and then Plavix alone.  Loop recorder will continue to be monitored for possible atrial fibrillation.  Maintain strict control of hypertension with blood pressure goal below 130/90, diabetes with hemoglobin A1c goal below 6.5% and cholesterol with LDL cholesterol (bad cholesterol) goal below 70 mg/dL.  I also advised the patient to eat a healthy diet with plenty of whole grains, cereals,  fruits and vegetables, exercise regularly with at least 30 minutes of continuous activity daily and maintain ideal body weight. 2. HTN: Ongoing use of midodrine 5 mg 3 times daily.   3. HLD: Continuation of atorvastatin 80 mg daily with ongoing follow-up with PCP for prescribing, monitoring and management 4. DMII: Controlled.  Continue to follow with PCP for ongoing monitoring management    Follow up in *** or call earlier if needed   I spent *** minutes of face-to-face and non-face-to-face time with patient.  This included previsit chart review, lab review, study review, order entry, electronic health record documentation, patient education regarding recent stroke, residual deficits, importance of managing stroke risk factors and answered all questions to patient satisfaction     Ihor Austin, [facility]  [facility] [address] [place], [place] [address]  Phone [phone] Fax [phone] Note: This document was prepared with digital dictation and possible smart phrase technology. Any transcriptional errors that result from this process are unintentional.

## 2019-09-17 MED ORDER — ONDANSETRON HCL 4 MG/2ML IJ SOLN
4.00 | INTRAMUSCULAR | Status: DC
Start: ? — End: 2019-09-17

## 2019-09-17 MED ORDER — GLUCAGON (RDNA) 1 MG IJ KIT
1.00 | PACK | INTRAMUSCULAR | Status: DC
Start: ? — End: 2019-09-17

## 2019-09-17 MED ORDER — DEXTROSE 10 % IV SOLN
125.00 | INTRAVENOUS | Status: DC
Start: ? — End: 2019-09-17

## 2019-09-17 MED ORDER — DSS 100 MG PO CAPS
100.00 | ORAL_CAPSULE | ORAL | Status: DC
Start: ? — End: 2019-09-17

## 2019-09-17 MED ORDER — DEXTROSE-SODIUM CHLORIDE 5-0.9 % IV SOLN
INTRAVENOUS | Status: DC
Start: ? — End: 2019-09-17

## 2019-09-17 MED ORDER — GLUCOSE 40 % PO GEL
15.00 | ORAL | Status: DC
Start: ? — End: 2019-09-17

## 2019-09-17 MED ORDER — ACETAMINOPHEN 650 MG RE SUPP
650.00 | RECTAL | Status: DC
Start: ? — End: 2019-09-17

## 2019-09-17 MED ORDER — ZOLPIDEM TARTRATE 5 MG PO TABS
5.00 | ORAL_TABLET | ORAL | Status: DC
Start: ? — End: 2019-09-17

## 2019-09-17 MED ORDER — ASPIRIN 300 MG RE SUPP
300.00 | RECTAL | Status: DC
Start: 2019-09-18 — End: 2019-09-17

## 2019-09-17 MED ORDER — HYDRALAZINE HCL 20 MG/ML IJ SOLN
10.00 | INTRAMUSCULAR | Status: DC
Start: ? — End: 2019-09-17

## 2019-09-17 MED ORDER — INSULIN LISPRO 100 UNIT/ML ~~LOC~~ SOLN
2.00 | SUBCUTANEOUS | Status: DC
Start: 2019-09-17 — End: 2019-09-17

## 2019-09-17 MED ORDER — ACETAMINOPHEN 325 MG PO TABS
650.00 | ORAL_TABLET | ORAL | Status: DC
Start: ? — End: 2019-09-17

## 2019-09-17 MED ORDER — ENOXAPARIN SODIUM 40 MG/0.4ML ~~LOC~~ SOLN
40.00 | SUBCUTANEOUS | Status: DC
Start: 2019-09-17 — End: 2019-09-17

## 2019-09-17 MED ORDER — ALUM & MAG HYDROXIDE-SIMETH 200-200-20 MG/5ML PO SUSP
30.00 | ORAL | Status: DC
Start: ? — End: 2019-09-17

## 2019-09-17 MED ORDER — CLONIDINE HCL 0.1 MG PO TABS
0.10 | ORAL_TABLET | ORAL | Status: DC
Start: ? — End: 2019-09-17

## 2019-09-30 LAB — CUP PACEART REMOTE DEVICE CHECK
Date Time Interrogation Session: 20210516165608
Implantable Pulse Generator Implant Date: 20210312

## 2019-10-03 ENCOUNTER — Ambulatory Visit (INDEPENDENT_AMBULATORY_CARE_PROVIDER_SITE_OTHER): Payer: Medicare Other | Admitting: *Deleted

## 2019-10-03 DIAGNOSIS — I63511 Cerebral infarction due to unspecified occlusion or stenosis of right middle cerebral artery: Secondary | ICD-10-CM | POA: Diagnosis not present

## 2019-10-05 NOTE — Progress Notes (Signed)
Carelink Summary Report / Loop Recorder 

## 2019-11-05 ENCOUNTER — Telehealth: Payer: Self-pay | Admitting: Emergency Medicine

## 2019-11-05 ENCOUNTER — Ambulatory Visit (INDEPENDENT_AMBULATORY_CARE_PROVIDER_SITE_OTHER): Payer: Medicare Other | Admitting: *Deleted

## 2019-11-05 DIAGNOSIS — I63511 Cerebral infarction due to unspecified occlusion or stenosis of right middle cerebral artery: Secondary | ICD-10-CM | POA: Diagnosis not present

## 2019-11-05 LAB — CUP PACEART REMOTE DEVICE CHECK
Date Time Interrogation Session: 20210620230528
Implantable Pulse Generator Implant Date: 20210312

## 2019-11-05 NOTE — Progress Notes (Signed)
Carelink Summary Report / Loop Recorder 

## 2019-11-05 NOTE — Telephone Encounter (Signed)
LMOM to call office, # and office hours given. Assess for s/sx during episode of SVT on 11/04/19 @ 0933.

## 2019-11-06 NOTE — Telephone Encounter (Signed)
Spoke with pt, he indicates eh was sleeping at time of episode.  He denies any cardiac symptoms at time of episode or currently.  Pt states he was advised by an MD on 10/25/19 to stop taking Aspirin 325mg ,.  He has however continued to take Plavix 75mg  daily.

## 2019-11-16 NOTE — Telephone Encounter (Signed)
Continue to monitor No change at this time.

## 2019-12-10 ENCOUNTER — Ambulatory Visit (INDEPENDENT_AMBULATORY_CARE_PROVIDER_SITE_OTHER): Payer: Medicare Other | Admitting: *Deleted

## 2019-12-10 DIAGNOSIS — I63511 Cerebral infarction due to unspecified occlusion or stenosis of right middle cerebral artery: Secondary | ICD-10-CM

## 2019-12-10 LAB — CUP PACEART REMOTE DEVICE CHECK
Date Time Interrogation Session: 20210725230622
Implantable Pulse Generator Implant Date: 20210312

## 2019-12-11 NOTE — Progress Notes (Signed)
Carelink Summary Report / Loop Recorder 

## 2020-01-14 ENCOUNTER — Ambulatory Visit (INDEPENDENT_AMBULATORY_CARE_PROVIDER_SITE_OTHER): Payer: Medicare Other | Admitting: *Deleted

## 2020-01-14 DIAGNOSIS — I63511 Cerebral infarction due to unspecified occlusion or stenosis of right middle cerebral artery: Secondary | ICD-10-CM

## 2020-01-14 LAB — CUP PACEART REMOTE DEVICE CHECK
Date Time Interrogation Session: 20210827230537
Implantable Pulse Generator Implant Date: 20210312

## 2020-01-16 NOTE — Progress Notes (Signed)
Carelink Summary Report / Loop Recorder 

## 2020-02-16 LAB — CUP PACEART REMOTE DEVICE CHECK
Date Time Interrogation Session: 20210929230509
Implantable Pulse Generator Implant Date: 20210312

## 2020-02-18 ENCOUNTER — Ambulatory Visit: Payer: Medicare Other

## 2020-02-18 DIAGNOSIS — I63511 Cerebral infarction due to unspecified occlusion or stenosis of right middle cerebral artery: Secondary | ICD-10-CM

## 2020-02-19 NOTE — Progress Notes (Signed)
Carelink Summary Report / Loop Recorder 

## 2020-02-29 ENCOUNTER — Telehealth: Payer: Self-pay

## 2020-02-29 NOTE — Telephone Encounter (Signed)
ILR implanted for CVA. Alert received for 4 AF events, longest 8 minutes. Patient called to assess. States he felt some shortness of breath yesterday while sitting no the couch but quickly resolved. No complaints since. No OAC on file.   Patient advised we will call him back after I speak with Dr. Stann Mainland review with Dr. Johney Frame.  Reviewed with Dr. Johney Frame. States EGM's are sinus w/ frequent ectopy. Continue to monitor.   Patient called and updated. Verbalized understanding.

## 2020-03-04 NOTE — Telephone Encounter (Signed)
Alert received for a new transmission, beginning of transmission appears PACS however last half of transmission questions.  Due  To implant for Cryptogenic stroke, reviewed with Dr. Elberta Fortis in office today, he advised to forward to Dr. Johney Frame for input.  I have not contacted pt since pt had similar issue within the last week and was asymptomatic.

## 2020-03-08 NOTE — Telephone Encounter (Signed)
Frequent ectopy is noted.  Continue to monitor.

## 2020-03-21 LAB — CUP PACEART REMOTE DEVICE CHECK
Date Time Interrogation Session: 20211101230352
Implantable Pulse Generator Implant Date: 20210312

## 2020-03-24 ENCOUNTER — Ambulatory Visit (INDEPENDENT_AMBULATORY_CARE_PROVIDER_SITE_OTHER): Payer: Medicare HMO

## 2020-03-24 DIAGNOSIS — I63511 Cerebral infarction due to unspecified occlusion or stenosis of right middle cerebral artery: Secondary | ICD-10-CM | POA: Diagnosis not present

## 2020-03-25 NOTE — Progress Notes (Signed)
Carelink Summary Report / Loop Recorder 

## 2020-04-26 LAB — CUP PACEART REMOTE DEVICE CHECK
Date Time Interrogation Session: 20211204230345
Implantable Pulse Generator Implant Date: 20210312

## 2020-04-28 ENCOUNTER — Ambulatory Visit (INDEPENDENT_AMBULATORY_CARE_PROVIDER_SITE_OTHER): Payer: Medicare HMO

## 2020-04-28 DIAGNOSIS — I63511 Cerebral infarction due to unspecified occlusion or stenosis of right middle cerebral artery: Secondary | ICD-10-CM | POA: Diagnosis not present

## 2020-05-13 NOTE — Progress Notes (Signed)
Carelink Summary Report / Loop Recorder 

## 2020-06-02 ENCOUNTER — Ambulatory Visit (INDEPENDENT_AMBULATORY_CARE_PROVIDER_SITE_OTHER): Payer: Medicare HMO

## 2020-06-02 DIAGNOSIS — I63511 Cerebral infarction due to unspecified occlusion or stenosis of right middle cerebral artery: Secondary | ICD-10-CM | POA: Diagnosis not present

## 2020-06-05 LAB — CUP PACEART REMOTE DEVICE CHECK
Date Time Interrogation Session: 20220115230528
Implantable Pulse Generator Implant Date: 20210312

## 2020-06-16 NOTE — Progress Notes (Signed)
Carelink Summary Report / Loop Recorder 

## 2020-07-07 ENCOUNTER — Ambulatory Visit (INDEPENDENT_AMBULATORY_CARE_PROVIDER_SITE_OTHER): Payer: Medicare HMO

## 2020-07-07 DIAGNOSIS — I63511 Cerebral infarction due to unspecified occlusion or stenosis of right middle cerebral artery: Secondary | ICD-10-CM | POA: Diagnosis not present

## 2020-07-07 LAB — CUP PACEART REMOTE DEVICE CHECK
Date Time Interrogation Session: 20220217230442
Implantable Pulse Generator Implant Date: 20210312

## 2020-07-14 NOTE — Progress Notes (Signed)
Carelink Summary Report / Loop Recorder 

## 2020-08-09 LAB — CUP PACEART REMOTE DEVICE CHECK
Date Time Interrogation Session: 20220322230310
Implantable Pulse Generator Implant Date: 20210312

## 2020-08-11 ENCOUNTER — Ambulatory Visit (INDEPENDENT_AMBULATORY_CARE_PROVIDER_SITE_OTHER): Payer: Medicare HMO

## 2020-08-11 DIAGNOSIS — I63511 Cerebral infarction due to unspecified occlusion or stenosis of right middle cerebral artery: Secondary | ICD-10-CM | POA: Diagnosis not present

## 2020-08-22 NOTE — Progress Notes (Signed)
Carelink Summary Report / Loop Recorder 

## 2020-09-08 ENCOUNTER — Ambulatory Visit (INDEPENDENT_AMBULATORY_CARE_PROVIDER_SITE_OTHER): Payer: Medicare HMO

## 2020-09-08 DIAGNOSIS — I63511 Cerebral infarction due to unspecified occlusion or stenosis of right middle cerebral artery: Secondary | ICD-10-CM

## 2020-09-09 LAB — CUP PACEART REMOTE DEVICE CHECK
Date Time Interrogation Session: 20220424230323
Implantable Pulse Generator Implant Date: 20210312

## 2020-09-10 DIAGNOSIS — I63511 Cerebral infarction due to unspecified occlusion or stenosis of right middle cerebral artery: Secondary | ICD-10-CM

## 2020-09-25 NOTE — Progress Notes (Signed)
Carelink Summary Report / Loop Recorder 

## 2020-10-13 LAB — CUP PACEART REMOTE DEVICE CHECK
Date Time Interrogation Session: 20220527230218
Implantable Pulse Generator Implant Date: 20210312

## 2020-10-14 ENCOUNTER — Ambulatory Visit (INDEPENDENT_AMBULATORY_CARE_PROVIDER_SITE_OTHER): Payer: Medicare HMO

## 2020-10-14 DIAGNOSIS — I63511 Cerebral infarction due to unspecified occlusion or stenosis of right middle cerebral artery: Secondary | ICD-10-CM

## 2020-11-05 NOTE — Progress Notes (Signed)
Carelink Summary Report / Loop Recorder 

## 2020-11-14 ENCOUNTER — Ambulatory Visit (INDEPENDENT_AMBULATORY_CARE_PROVIDER_SITE_OTHER): Payer: Medicare HMO

## 2020-11-14 DIAGNOSIS — I63511 Cerebral infarction due to unspecified occlusion or stenosis of right middle cerebral artery: Secondary | ICD-10-CM

## 2020-11-17 LAB — CUP PACEART REMOTE DEVICE CHECK
Date Time Interrogation Session: 20220629230506
Implantable Pulse Generator Implant Date: 20210312

## 2020-12-04 NOTE — Progress Notes (Signed)
Carelink Summary Report / Loop Recorder 

## 2020-12-16 LAB — CUP PACEART REMOTE DEVICE CHECK
Date Time Interrogation Session: 20220801230253
Implantable Pulse Generator Implant Date: 20210312

## 2020-12-17 ENCOUNTER — Ambulatory Visit (INDEPENDENT_AMBULATORY_CARE_PROVIDER_SITE_OTHER): Payer: Medicare HMO

## 2020-12-17 DIAGNOSIS — I63511 Cerebral infarction due to unspecified occlusion or stenosis of right middle cerebral artery: Secondary | ICD-10-CM

## 2021-01-13 NOTE — Progress Notes (Signed)
Carelink Summary Report / Loop Recorder 

## 2021-01-20 ENCOUNTER — Ambulatory Visit (INDEPENDENT_AMBULATORY_CARE_PROVIDER_SITE_OTHER): Payer: Medicare HMO

## 2021-01-20 DIAGNOSIS — I63511 Cerebral infarction due to unspecified occlusion or stenosis of right middle cerebral artery: Secondary | ICD-10-CM | POA: Diagnosis not present

## 2021-01-20 LAB — CUP PACEART REMOTE DEVICE CHECK
Date Time Interrogation Session: 20220903230843
Implantable Pulse Generator Implant Date: 20210312

## 2021-01-29 NOTE — Progress Notes (Signed)
Carelink Summary Report / Loop Recorder 

## 2022-02-25 IMAGING — MR MR HEAD W/O CM
10 series · 48 of 48 positions shown · non-contrast
Comparison: CTA head and CT perfusion earlier today

CLINICAL DATA: Stroke follow-up.

EXAM:
MRI HEAD WITHOUT CONTRAST
TECHNIQUE: Multiplanar, multiecho pulse sequences of the brain and surrounding
structures were obtained without intravenous contrast.

[Series 9: DWI · axial · 3.0mm · 0.88mm/px · z∈[-73,+62]mm · 11 of 94 slices shown (1 of 4)]
[im 1/94]
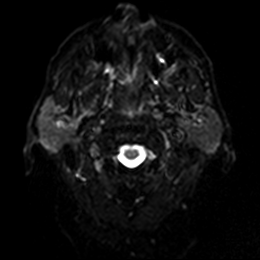
[im 10/94]
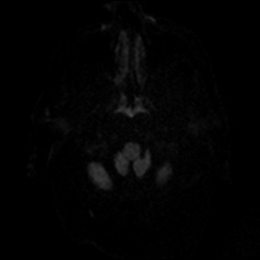
[im 19/94]
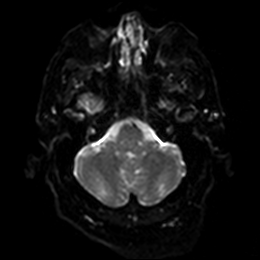
[im 28/94]
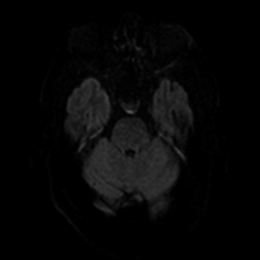
[im 38/94]
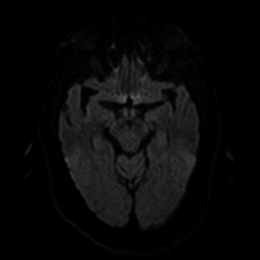
[im 47/94]
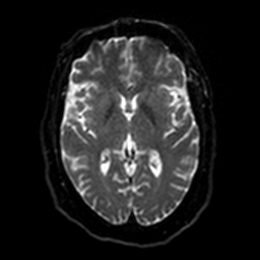
[im 56/94]
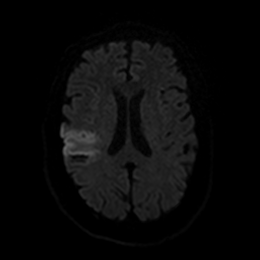
[im 66/94]
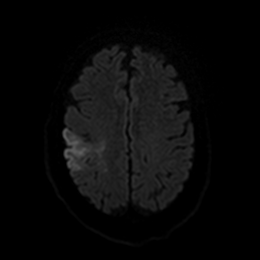
[im 75/94]
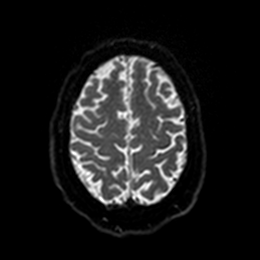
[im 84/94]
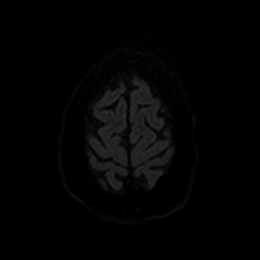
[im 94/94]
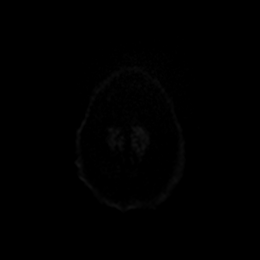

[Series 10: DWI · axial · 3.0mm · 0.88mm/px · z∈[-73,+62]mm · 6 of 47 slices shown (2 of 4)]
[im 1/47]
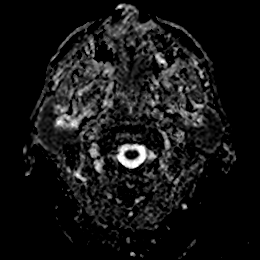
[im 10/47]
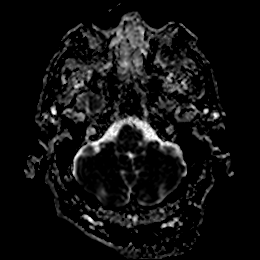
[im 19/47]
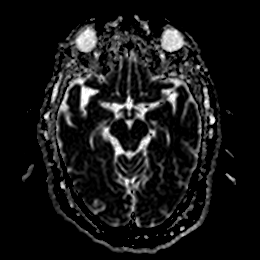
[im 28/47]
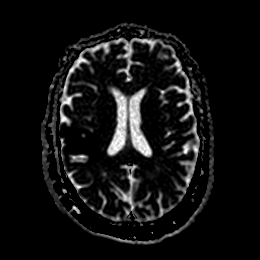
[im 37/47]
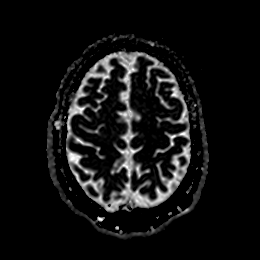
[im 47/47]
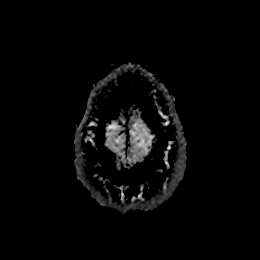

[Series 11: DWI · coronal · 4.0mm · 0.88mm/px · 9 of 72 slices shown (3 of 4)]
[im 1/72]
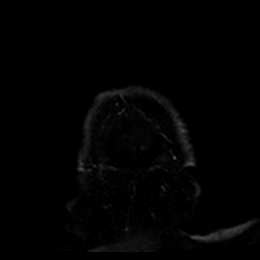
[im 9/72]
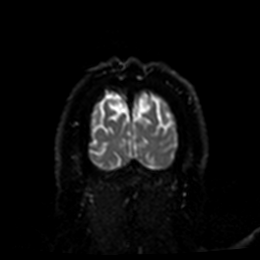
[im 18/72]
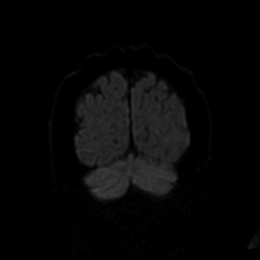
[im 27/72]
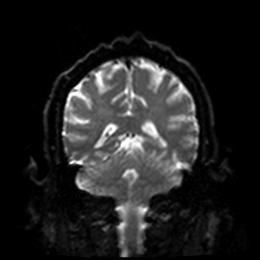
[im 36/72]
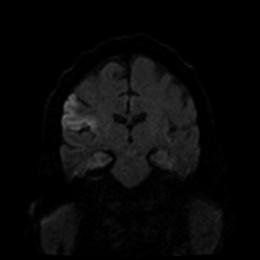
[im 45/72]
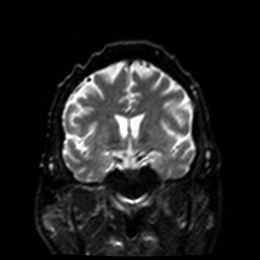
[im 54/72]
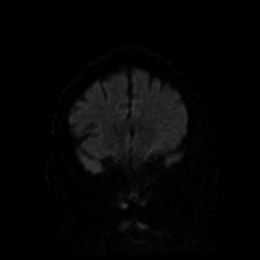
[im 63/72]
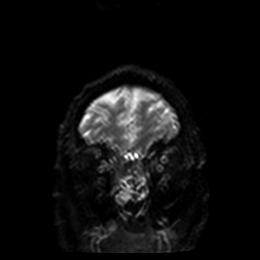
[im 72/72]
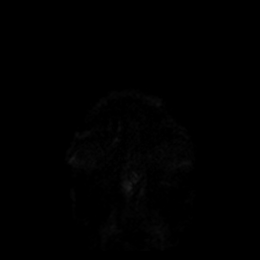

[Series 12: DWI · coronal · 4.0mm · 0.88mm/px · 4 of 36 slices shown (4 of 4)]
[im 1/36]
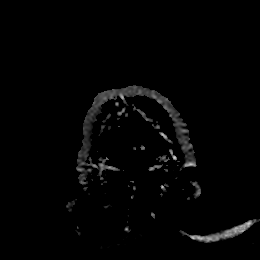
[im 12/36]
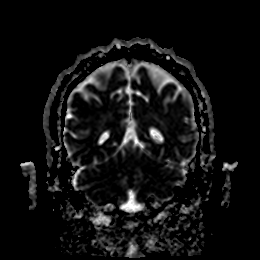
[im 24/36]
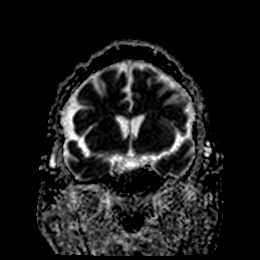
[im 36/36]
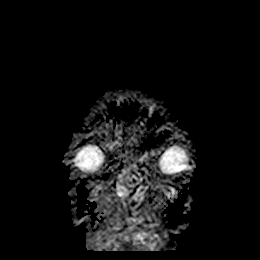

[Series 13: T1 · sagittal · 5.0mm · 0.75mm/px · 3 of 23 slices shown]
[im 1/23]
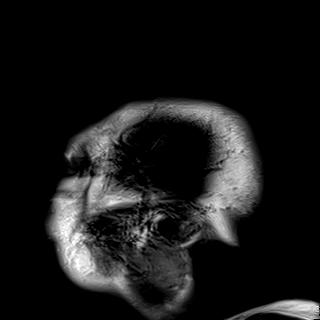
[im 12/23]
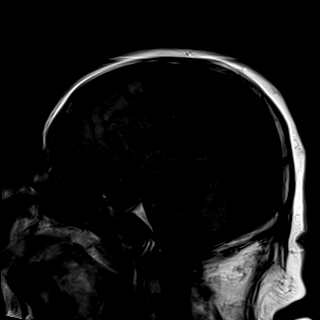
[im 23/23]
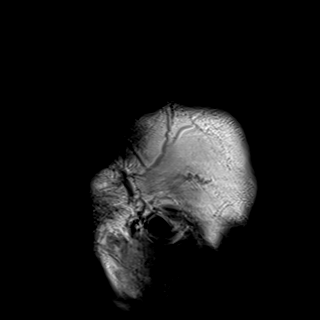

[Series 14: T2 · axial · 5.0mm · 0.72mm/px · z∈[-75,+66]mm · 3 of 25 slices shown (1 of 2)]
[im 1/25]
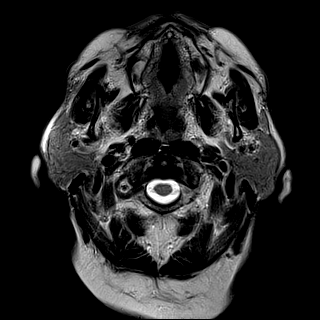
[im 13/25]
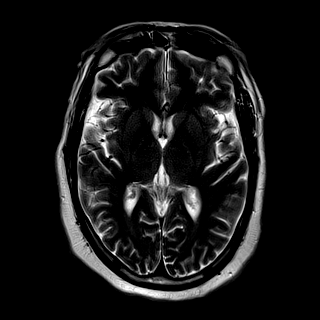
[im 25/25]
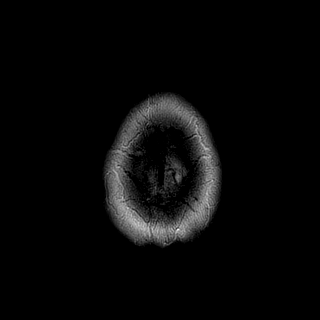

[Series 15: FLAIR · axial · 5.0mm · 0.90mm/px · z∈[-75,+66]mm · 3 of 25 slices shown (1 of 2)]
[im 1/25]
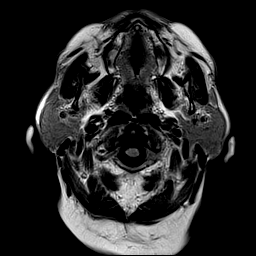
[im 13/25]
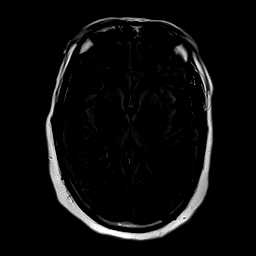
[im 25/25]
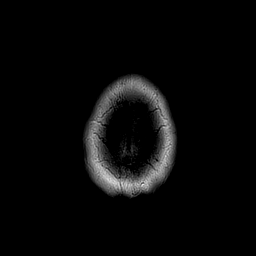

[Series 16: ax hemo · axial · 5.0mm · 0.86mm/px · z∈[-78,+62]mm · 3 of 25 slices shown]
[im 1/25]
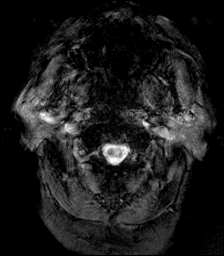
[im 13/25]
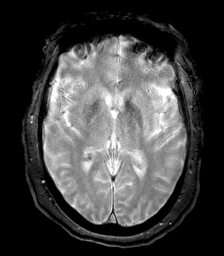
[im 25/25]
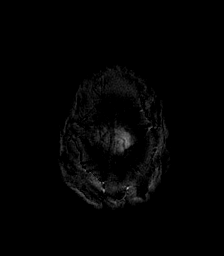

[Series 17: FLAIR · axial · 5.0mm · 0.90mm/px · z∈[-75,+66]mm · 3 of 25 slices shown (2 of 2)]
[im 1/25]
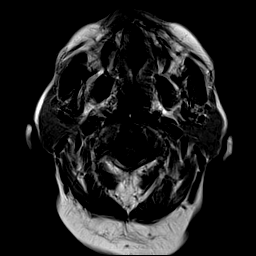
[im 13/25]
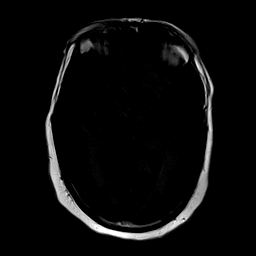
[im 25/25]
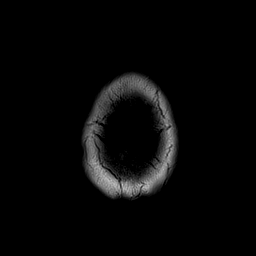

[Series 18: T2 · coronal · 5.0mm · 0.72mm/px · 3 of 28 slices shown (2 of 2)]
[im 1/28]
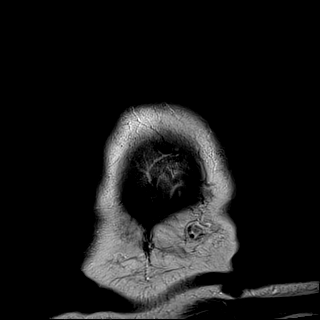
[im 14/28]
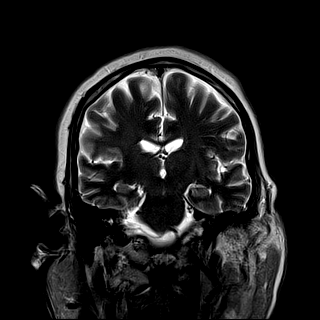
[im 28/28]
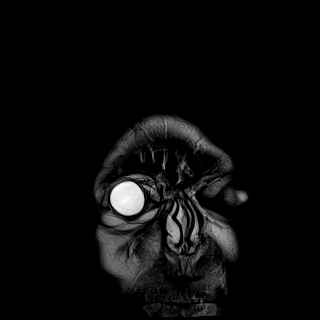

[48 of 48 positions shown; findings below may reference images not displayed]

FINDINGS: Brain: Acute infarct right parietal lobe. This appears to involve
primarily the sensory cortex but also some of the motor cortex. No
associated hemorrhage

Ventricle size normal. Negative for mass lesion. No significant
chronic ischemia.

Vascular: Normal arterial flow voids.

Skull and upper cervical spine: Negative

Sinuses/Orbits: Mild mucosal edema paranasal sinuses. Disconjugate
gaze. No orbital mass.

Other: None
IMPRESSION: Acute infarct right parietal lobe which appears to predominantly
involve the sensory cortex with a smaller amount of motor cortex
involvement. No associated hemorrhage. No chronic ischemia.

## 2022-02-25 IMAGING — CT CT HEAD CODE STROKE
3 series · 15 of 47 positions shown, 18 images · non-contrast
Comparison: None available.

CLINICAL DATA: Code stroke. Initial evaluation for acute left-sided
weakness, numbness, possible stroke.

EXAM:
CT HEAD WITHOUT CONTRAST
TECHNIQUE: Contiguous axial images were obtained from the base of the skull
through the vertex without intravenous contrast.

[Series 3: head 5.0 st · axial · 0.48mm/px · z∈[-103,+57]mm · 9 of 38 slices shown, 12 images]
[im 3/38  brain]
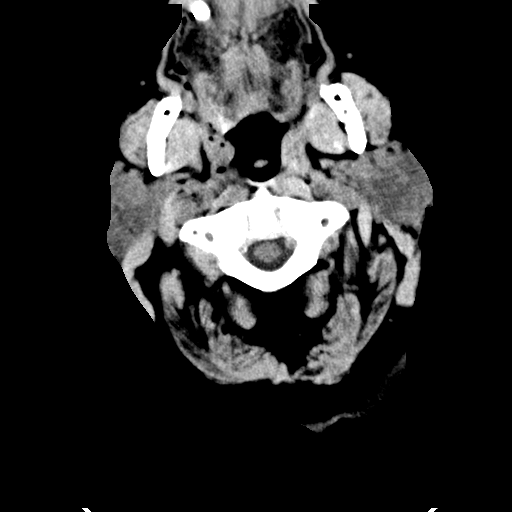
[im 3/38  bone]
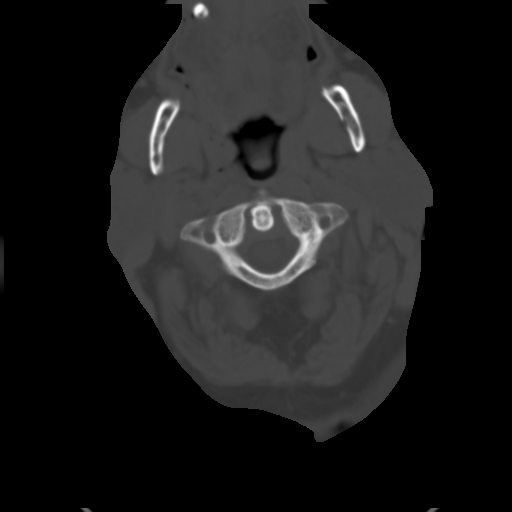
[im 7/38  brain]
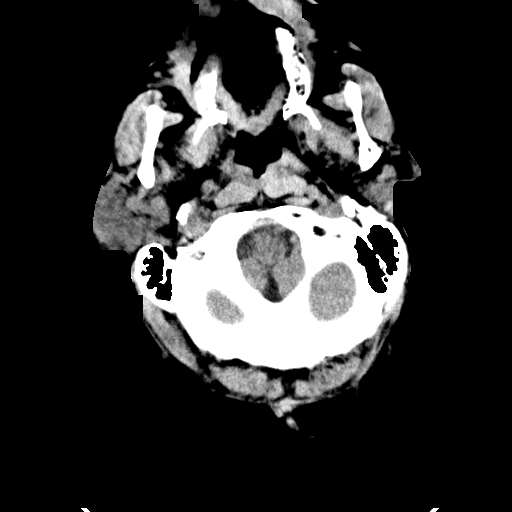
[im 11/38  brain]
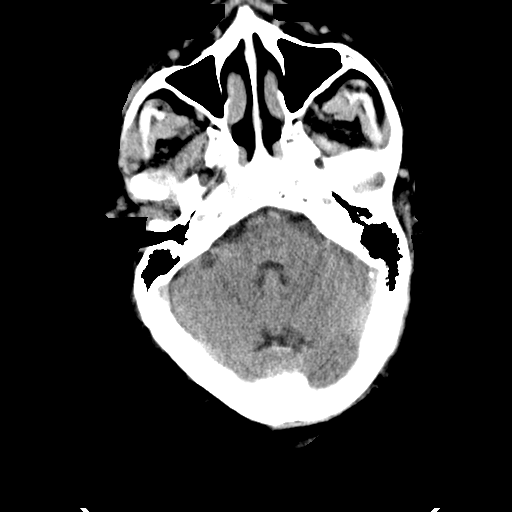
[im 15/38  brain]
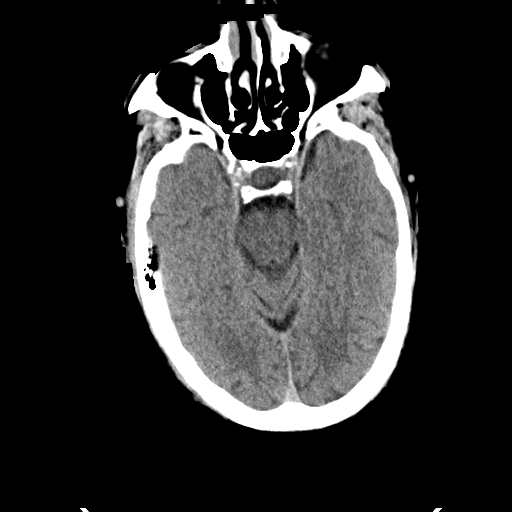
[im 20/38  brain]
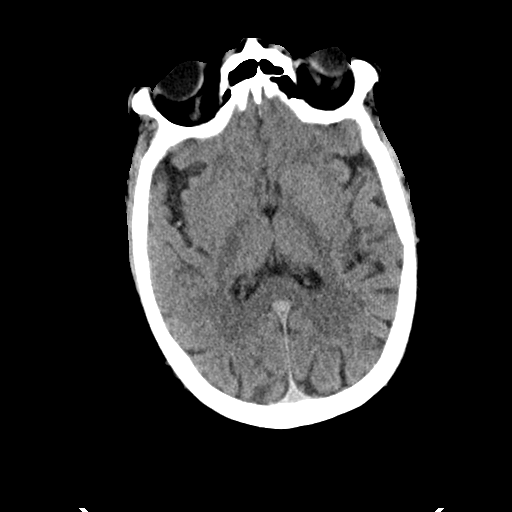
[im 20/38  bone]
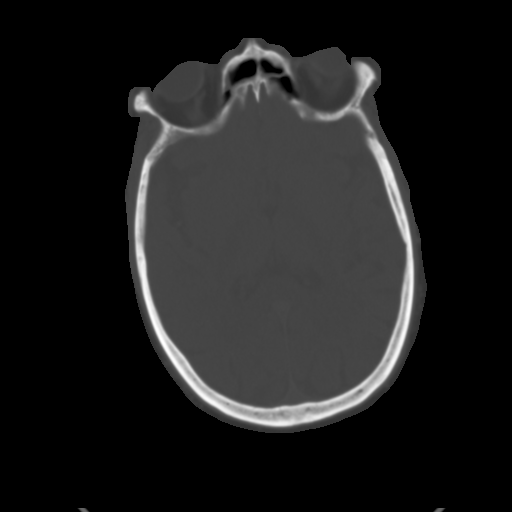
[im 23/38  brain]
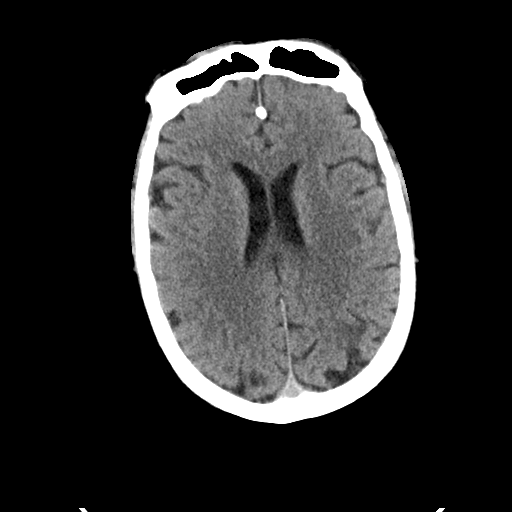
[im 27/38  brain]
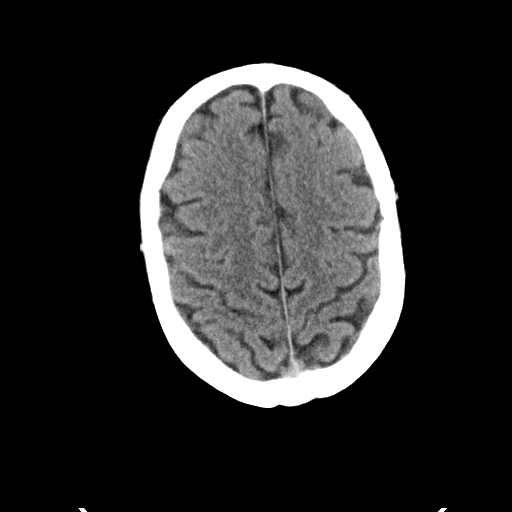
[im 31/38  brain]
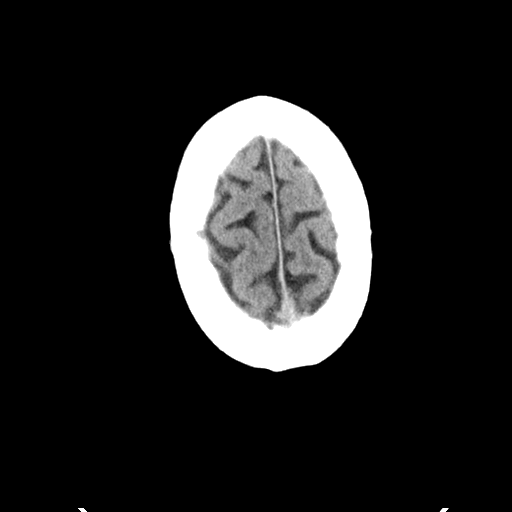
[im 35/38  brain]
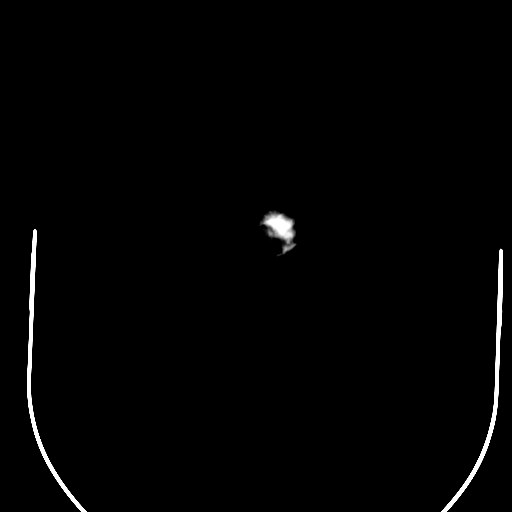
[im 35/38  bone]
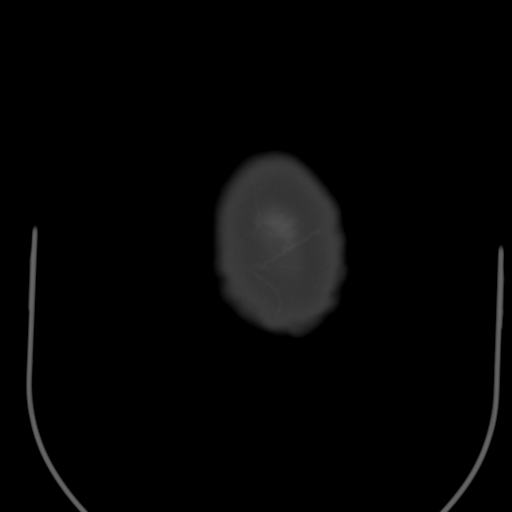

[Series 5: head 3.0 cor st · coronal · 0.38mm/px · 3 of 79 slices shown]
[im 27/79  brain]
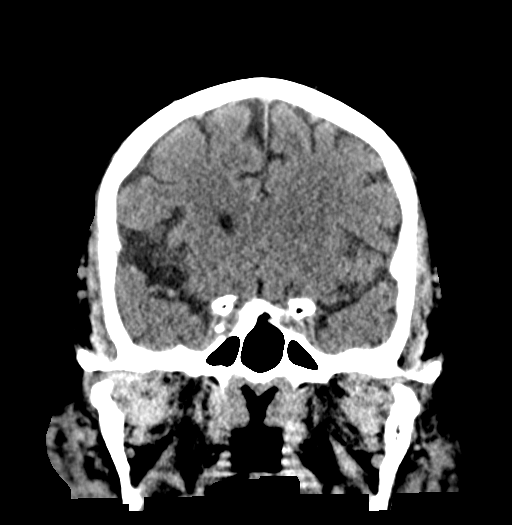
[im 35/79  brain]
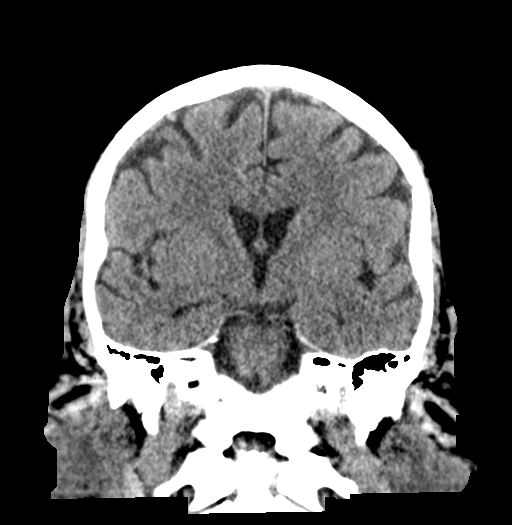
[im 44/79  brain]
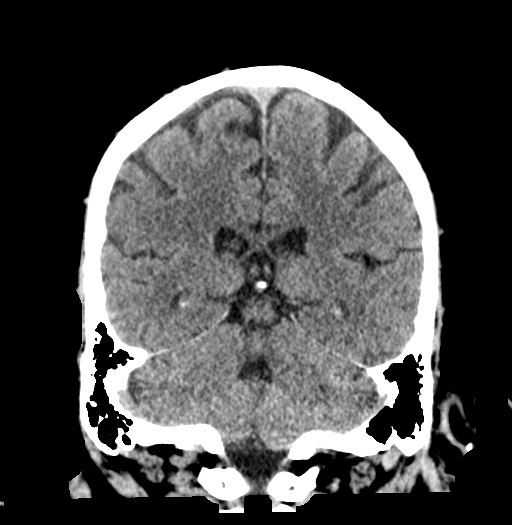

[Series 6: head 3.0 sag st · sagittal · 0.37mm/px · 3 of 65 slices shown]
[im 22/65  brain]
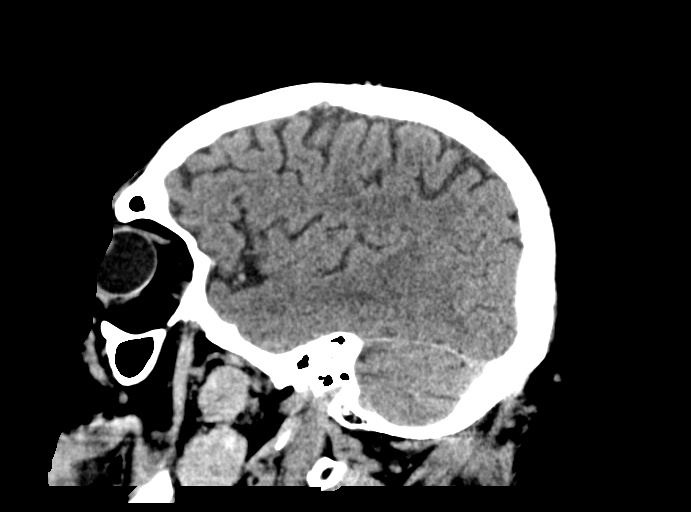
[im 33/65  brain]
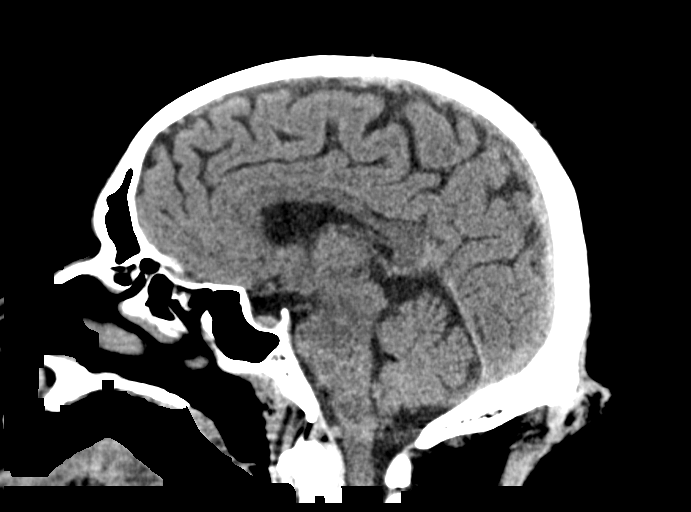
[im 43/65  brain]
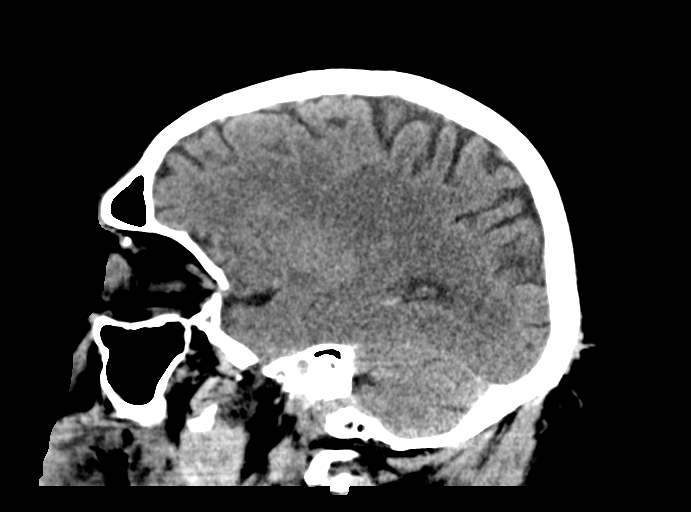

[15 of 47 positions shown; findings below may reference images not displayed]

FINDINGS: Brain: Cerebral volume within normal limits for age. No acute
intracranial hemorrhage. No acute large vessel territory infarct. No
mass lesion, mass effect, or midline shift. No hydrocephalus. No
extra-axial fluid collection.

Vascular: No hyperdense vessel. Scattered vascular calcifications
noted within the carotid siphons. Punctate hyperdensity seen on a
single slice at the right sylvian fissure favored to reflect a small
focal plaque (series 3, image 20).

Skull: Scalp soft tissues within normal limits.  Calvarium intact.

Sinuses/Orbits: Globes and orbital soft tissues demonstrate no acute
finding. Chronic mucoperiosteal thickening noted throughout the
paranasal sinuses. Mastoid air cells are clear.

Other: None.

ASPECTS (Alberta Stroke Program Early CT Score)

- Ganglionic level infarction (caudate, lentiform nuclei, internal
capsule, insula, M1-M3 cortex): 7

- Supraganglionic infarction (M4-M6 cortex): 3

Total score (0-10 with 10 being normal): 10
IMPRESSION: 1. No acute intracranial infarct or other abnormality.
2. ASPECTS is 10.

These results were communicated to Dr. Lucildo at [DATE] Frim
07/26/2019by text page via the [employer] messaging system.
